# Patient Record
Sex: Female | Born: 1935
Health system: Southern US, Community
[De-identification: ages and names within clinical notes are randomized; demographics above are authoritative.]

## PROBLEM LIST (undated history)

## (undated) DIAGNOSIS — E538 Deficiency of other specified B group vitamins: Secondary | ICD-10-CM

## (undated) DIAGNOSIS — I10 Essential (primary) hypertension: Secondary | ICD-10-CM

## (undated) DIAGNOSIS — R634 Abnormal weight loss: Secondary | ICD-10-CM

## (undated) DIAGNOSIS — F32A Depression, unspecified: Secondary | ICD-10-CM

## (undated) DIAGNOSIS — G309 Alzheimer's disease, unspecified: Secondary | ICD-10-CM

## (undated) DIAGNOSIS — R1314 Dysphagia, pharyngoesophageal phase: Secondary | ICD-10-CM

## (undated) DIAGNOSIS — R911 Solitary pulmonary nodule: Secondary | ICD-10-CM

## (undated) DIAGNOSIS — M549 Dorsalgia, unspecified: Secondary | ICD-10-CM

## (undated) DIAGNOSIS — E039 Hypothyroidism, unspecified: Secondary | ICD-10-CM

## (undated) DIAGNOSIS — R1310 Dysphagia, unspecified: Secondary | ICD-10-CM

## (undated) DIAGNOSIS — E559 Vitamin D deficiency, unspecified: Secondary | ICD-10-CM

## (undated) DIAGNOSIS — R413 Other amnesia: Secondary | ICD-10-CM

## (undated) DIAGNOSIS — M8580 Other specified disorders of bone density and structure, unspecified site: Secondary | ICD-10-CM

## (undated) DIAGNOSIS — M797 Fibromyalgia: Secondary | ICD-10-CM

## (undated) DIAGNOSIS — F0280 Dementia in other diseases classified elsewhere without behavioral disturbance: Secondary | ICD-10-CM

## (undated) DIAGNOSIS — R32 Unspecified urinary incontinence: Secondary | ICD-10-CM

## (undated) DIAGNOSIS — F321 Major depressive disorder, single episode, moderate: Secondary | ICD-10-CM

## (undated) DIAGNOSIS — E079 Disorder of thyroid, unspecified: Secondary | ICD-10-CM

## (undated) HISTORY — DX: Alzheimer's disease, unspecified: G30.9

## (undated) HISTORY — DX: Dementia in other diseases classified elsewhere, unspecified severity, without behavioral disturbance, psychotic disturbance, mood disturbance, and anxiety: F02.80

## (undated) HISTORY — DX: Disorder of thyroid, unspecified: E07.9

## (undated) HISTORY — DX: Dysphagia, pharyngoesophageal phase: R13.14

## (undated) HISTORY — PX: CATARACT EXTRACTION: SUR2

## (undated) HISTORY — DX: Solitary pulmonary nodule: R91.1

## (undated) HISTORY — DX: Essential (primary) hypertension: I10

## (undated) HISTORY — DX: Other specified disorders of bone density and structure, unspecified site: M85.80

## (undated) HISTORY — DX: Fibromyalgia: M79.7

## (undated) HISTORY — DX: Hypothyroidism, unspecified: E03.9

## (undated) HISTORY — PX: APPENDECTOMY: SHX54

## (undated) HISTORY — DX: Dysphagia, unspecified: R13.10

## (undated) HISTORY — DX: Abnormal weight loss: R63.4

## (undated) HISTORY — DX: Depression, unspecified: F32.A

## (undated) HISTORY — PX: SALPINGOOPHORECTOMY: SHX82

## (undated) HISTORY — DX: Dorsalgia, unspecified: M54.9

## (undated) HISTORY — DX: Unspecified urinary incontinence: R32

## (undated) HISTORY — DX: Vitamin D deficiency, unspecified: E55.9

## (undated) HISTORY — PX: VAGINAL HYSTERECTOMY: SHX2639

## (undated) HISTORY — PX: BACK SURGERY: SHX140

## (undated) HISTORY — DX: Other amnesia: R41.3

## (undated) HISTORY — DX: Deficiency of other specified B group vitamins: E53.8

## (undated) HISTORY — DX: Major depressive disorder, single episode, moderate: F32.1

---

## 2007-03-17 ENCOUNTER — Inpatient Hospital Stay (HOSPITAL_COMMUNITY): Admission: EM | Admit: 2007-03-17 | Discharge: 2007-03-19 | Payer: Self-pay | Admitting: Emergency Medicine

## 2007-03-17 ENCOUNTER — Encounter (INDEPENDENT_AMBULATORY_CARE_PROVIDER_SITE_OTHER): Payer: Self-pay | Admitting: Pediatrics

## 2010-11-21 ENCOUNTER — Encounter
Admission: RE | Admit: 2010-11-21 | Discharge: 2010-11-21 | Payer: Self-pay | Source: Home / Self Care | Attending: Family Medicine | Admitting: Family Medicine

## 2011-03-06 NOTE — Discharge Summary (Signed)
NAME:  Jenny [name], Jenny [name]               ACCOUNT NO.:  [id number]   MEDICAL RECORD NO.:  [id number]          PATIENT TYPE:  INP   LOCATION:  3728                         FACILITY:  [facility]   PHYSICIAN:  Pramod P. Pearlean Brownie, MD    DATE OF BIRTH:  05-18-1932   DATE OF ADMISSION:  03/17/2007  DATE OF DISCHARGE:  03/19/2007                               DISCHARGE SUMMARY   ADMITTING DIAGNOSIS:  Garbled speech and left field cut.   DISCHARGE DIAGNOSES:  1. Transient speech difficulties and left-sided vision loss with      headache, likely complicated by migraine.  2. Hyperlipidemia, newly diagnosed.  3. Hypertension.  4. Hyperhomocysteinemia anemia, newly diagnosed.   HOSPITAL COURSE:  The patient was a 77 year old pleasant Caucasian lady  who was admitted with symptoms of sudden onset of speech difficulties,  with left-sided vision difficulties, not being able to see in the front  of the left eye only.  She also had a headache subsequently.  The  symptoms lasted for about an hour, and recovered completely after she  came to the emergency room.  She was evaluated by Dr. Sharene Skeans and  thought to have an NIH stroke scale of zero.  She was thought to have a  right brain TIA, and so she was admitted to the stroke team.  Telemetry  monitoring did not reveal cardiac arrhythmia.  MRI scan of the brain  subsequently showed no evidence of acute infarct.  MRA of the brain  showed no large-vessel stenosis.  There was a small 2-mm area of  possible aneurysm noted in the right cavernous portion of the internal  carotid artery.  A 2-D echo showed no obvious cardiac __________, with  normal ejection fraction.   Total cholesterol was elevated at 243, triglycerides 286, HDL 42, LDL  144.  Hemoglobin A1C was normal at 5.7.  Urinalysis was unremarkable.  CBC and basic metabolic panel were normal.  Patient was started on  aspirin for stroke prevention.  She was advised conservative followup  for a possible 2-mm  aneurysm, which was not likely to explain her  symptoms.  She was also started on a statin for her elevated cholesterol  and was advised to follow up with her family physician, Dr. Neldon Labella, in  [place], and with Dr. Sharene Skeans in a month.   DISCHARGE MEDICATIONS:  Her medications at the time of discharge were  the following:  1. Lotrel 5/20 once a day.  2. Synthroid 0.175 mg daily.  3. Premarin once a day.  4. Advil 800 mg as needed.  5. Aspirin 325 mg a day.  6. Zocor 20 mg a day.  7. Seroquel 1 tablet daily.           ______________________________  [name]. Pearlean Brownie, MD     PPS/MEDQ  D:  03/19/2007  T:  03/20/2007  Job:  [id number]   cc:   Dr. Neldon Labella in [facility]

## 2011-03-06 NOTE — H&P (Signed)
NAME:  Jenny [name], Jenny [name]               ACCOUNT NO.:  [id number]   MEDICAL RECORD NO.:  [id number]          PATIENT TYPE:  EMS   LOCATION:  MAJO                         FACILITY:  [facility]   PHYSICIAN:  Deanna Artis. Hickling, M.D.DATE OF BIRTH:  1932/01/23   DATE OF ADMISSION:  03/17/2007  DATE OF DISCHARGE:                              HISTORY & PHYSICAL   CHIEF COMPLAINT:  Garbled speech and left field cut.   [age]-year-old, widowed, right-handed, white woman, last known  normal at 11:50 a.m.  Patient was on the phone and had sudden onset of  dysfluent aphasia and visual obscuration to the left at midline.  She  arrived at [facility] at 12:24 by car.  Triage, at 1245, [facility] called  to be at 1252.  CT of brain at 1303, interpreted as normal by me at  1318.  She was assessed by Dr. Vanetta Mulders of the emergency  department.  Called placed to me at 1258 and told me that the patient  had returned to baseline.   Currently, the patient complains of a right frontal occipital headache.  No prior stroke.  A 2 year history of hypertension, remote history of  smoking greater than 20 years ago.  No other risk factors.   REVIEW OF SYSTEMS:  Twelve system review; otherwise, negative.   PAST MEDICAL HISTORY:  1. No recent illnesses.  2. She is postmenopausal.  3. She has osteoarthritis of the neck and low back.  4. Hypothyroidism.  5. Hypertension.   PAST SURGICAL HISTORY:  1. Cervical laminectomy with fusion.  2. Lumbar laminectomy.  3. Hysterectomy.  4. Appendectomy.  5. Tonsillectomy.   MEDICATIONS:  Premarin, Synthroid and a blood pressure pill.  The  Premarin dose is known at 1.25.  The Synthroid dose is unknown to me, as  is the blood pressure pill.  Family is checking.   ALLERGIES:  1. CODEINE, MAKES HER SICK.  2. SULFONAMIDES, CAUSE HIVES.   FAMILY HISTORY:  Father died of a fatal stroke in his 81s.  Mother had  Alzheimer, pneumonia and heart disease.  The pneumonia  ultimately caused  her death.  Multiple siblings with vascular and cardiac disorder.  One  sibling with stroke at age 69.   SOCIAL HISTORY:  The patient lives independently.  She is a retired  Engineer, civil (consulting).  She has been widowed for 10 years.  Her sister-in-law is at  bedside.   PHYSICAL EXAMINATION:  VITAL SIGNS:  Temperature 98.1.  Blood pressure  169/71, resting.  Pulse 71.  Respirations 22.  Oxygen saturation 98%.  HEENT:  No bruits or meningismus.  No infection.  LUNGS:  Clear.  HEART:  No murmurs.  Pulses normal.  ABDOMEN:  Soft.  Bowel sounds normal.  No hepatosplenomegaly.  Abdomen  is protuberant.  EXTREMITIES:  Normal.  NEUROLOGIC EXAMINATION:  NIH Stroke Scale was equal to 0.  Modified  Rankin was 0.  Awake, alert.  No dysphagia.  Cranial nerves:  Nonreactive pupils.  Fundi normal.  Visual fields full to double  stimulus stimuli.  Extraocular movements full.  Symmetric facial  strength.  Midline tongue.  Near conduction greater than bone conduction  bilaterally.  Motor examination:  Normal strength, tone and mass.  Good  fine motor movements.  No pronator drift.  Sensation intact, full.  Peripheral stocking glove neuropathy.  Good stereognosis.  Cerebellar  examination:  Good finger to nose.  Heel, knee, shin, gait was normal.  Both heel toe walking in tandem.  Deep tendon reflexes are diminished.  Patient had bilateral flexor plantar responses.   IMPRESSION:  Transient ischemic attack, 435.8.  By her history this  would suggest both left and right brain events.  It seems unlikely  without loss of consciousness.  Patient has no history of atrial  fibrillation and no other risk factors for embolic stroke.   PLAN:  Admit patient to the hospital.  Review MRI, MRA, 2D  echocardiogram, carotid Doppler, serum homocystine, hemoglobin A1c,  lipid panel.  She will continue to take her home medications.  She is a  nonsmoker, having quit 20 years ago and does not need smoking cessation   consultation.  She has no need for a swallowing study because this is a  transient ischemic attack.  She is not a tPA candidate because this is a  transient ischemic attack.  I appreciate the opportunity to participate  in her care.      Deanna Artis. Sharene Skeans, M.D.  Electronically Signed     [facility]/MEDQ  D:  03/17/2007  T:  03/17/2007  Job:  [id number]

## 2011-03-28 ENCOUNTER — Ambulatory Visit: Payer: Self-pay | Admitting: Family Medicine

## 2011-03-29 ENCOUNTER — Ambulatory Visit (INDEPENDENT_AMBULATORY_CARE_PROVIDER_SITE_OTHER): Payer: Medicare Other | Admitting: Family Medicine

## 2011-03-29 ENCOUNTER — Encounter: Payer: Self-pay | Admitting: Family Medicine

## 2011-03-29 VITALS — BP 139/81 | HR 69 | Ht 65.5 in | Wt 195.0 lb

## 2011-03-29 DIAGNOSIS — M722 Plantar fascial fibromatosis: Secondary | ICD-10-CM | POA: Insufficient documentation

## 2011-03-29 DIAGNOSIS — M79672 Pain in left foot: Secondary | ICD-10-CM | POA: Insufficient documentation

## 2011-03-29 DIAGNOSIS — M79609 Pain in unspecified limb: Secondary | ICD-10-CM

## 2011-03-29 NOTE — Progress Notes (Signed)
  Subjective:    Patient ID: Jenny [name], female    DOB: [birth year], 77 y.o.   MRN: [id number]  HPI Jenny [name] presents for L foot pain x 1 month.  She was referred to [place] by her PCP, Jenny [name].  Pain is located from the heel to the arch of left foot.  Pain is worse at the end of the day.  It has not limited her activities, but she did cut short her volunteer time at the [employer] on Wednesday.  She sometimes has swelling around the ankle. She also noticed that there is callous on bunion and 1st toe on left foot.  There is sometimes pain at the bunion and 1st toe.  She has not tried an medication for pain.    Review of Systems No fever, chills, falling, lightheadedness    Objective:   Physical Exam GEN: nad, aox3 EXT: +2 pulses MUSC:  Observation: high arch bilaterally, but more on left side. Callous on plantar surface of 1st toe and bunion.  Gait: antalgic, favoring the L side Palpation: mild swelling along medial malleolus. +tenderness along plantar fascia and heel   Ultrasound guided exam:     Assessment & Plan:

## 2011-03-29 NOTE — Patient Instructions (Signed)
1) You have mild plantar fasciitis and are starting to have collapse of your arches (fallen arches). 2) Wear new insoles with pads in place.  You may place additional white pads in any sandles. 3) Start doing exercises/stretches as shown - try to do these twice a day. 4) Ice bath at end of the day. 5) May use tylenol or ibuprofen as needed. 6) Follow-up as needed.

## 2011-03-29 NOTE — Assessment & Plan Note (Addendum)
L foot pain likely related to mild plantar fasciitis & foot breakdown.  She also has early bunion formation on both feet - Sports Insoles given with bilateral first ray post, small scaphoid pads, & MT pad on the left.  These were comfortable in office today.  [employer] catalog given. - Handout for plantar fascia exercises given. - Should wear shoes with wide toe box - Follow-up prn

## 2011-03-29 NOTE — Assessment & Plan Note (Signed)
Mild plantar fasciitis of the left foot with MSK ultrasound showing thickness 0.50 cm compared to 0.3 cm on the right. - Handout on plantar fascia exercise given. Sports and soles provided as noted above - Follow up as needed

## 2011-03-29 NOTE — Progress Notes (Signed)
  Subjective:    Patient ID: Jenny [name], female    DOB: [birth year], 77 y.o.   MRN: [id number]  HPI  Jenny [name] presents for L foot pain x 1 month.  She was referred to [place] by her PCP, Dr Sandi Mealy.  Pain is located from the heel to the arch of left foot.  Pain is worse at the end of the day.  It has not limited her activities, but she did cut short her volunteer time at the [employer] on Wednesday.  She sometimes has swelling around the ankle. She also noticed that there is callous on bunion and 1st toe on left foot.  There is sometimes pain at the bunion and 1st toe.  She has not tried an medication for pain. She denies any numbness or tingling.  ALLERGIES: Codeine, statins, sulfa MEDS: Aspirin, losartan, Synthroid PMH: Hypothyroid, hypertension   Review of Systems  No fever, chills, falling, lightheadedness    Objective:   Physical Exam  GEN: nad, aox3 EXT: +2 pulses MUSC:  - Foot/ankle: Ankles with full range of motion bilaterally without pain, swelling, weakness, laxity. Feet with high arch bilaterally. Morton's callous along 2nd MT head is noted.  Also with callous on plantar surface of 1st toe and bunion. Early bunion formation bilaterally left greater than right. Decreased great toe motion bilaterally with hallux rigidus. Good posterior tibialis function. Tender palpation along insertion of left plantar fascial and medial calcaneus, and no right plantar fascial tenderness.  Gait: antalgic, favoring the L side, no leg length difference Neurovascularly intact distally  MSK ultrasound: Exam of left plantar fascia revealed thickness of 0.50 cm compared to 0.28 cm on the right. No signs of tearing. No heel spurs appreciated. Brief exam of first MTP joint bilaterally shows DJD. Images saved.     Assessment & Plan:

## 2011-10-11 ENCOUNTER — Ambulatory Visit: Payer: Medicare Other | Attending: Family Medicine | Admitting: Physical Therapy

## 2011-10-11 DIAGNOSIS — IMO0001 Reserved for inherently not codable concepts without codable children: Secondary | ICD-10-CM | POA: Insufficient documentation

## 2011-10-11 DIAGNOSIS — H8110 Benign paroxysmal vertigo, unspecified ear: Secondary | ICD-10-CM | POA: Insufficient documentation

## 2011-10-17 ENCOUNTER — Ambulatory Visit: Payer: Medicare Other | Admitting: Physical Therapy

## 2011-10-29 ENCOUNTER — Encounter: Payer: Medicare Other | Admitting: Physical Therapy

## 2011-10-31 ENCOUNTER — Encounter: Payer: Medicare Other | Admitting: Physical Therapy

## 2013-06-16 ENCOUNTER — Other Ambulatory Visit: Payer: Self-pay

## 2013-06-16 ENCOUNTER — Ambulatory Visit
Admission: RE | Admit: 2013-06-16 | Discharge: 2013-06-16 | Disposition: A | Discharge status: Home / Self Care | Payer: Medicare Other | Source: Ambulatory Visit

## 2013-06-16 DIAGNOSIS — Q766 Other congenital malformations of ribs: Secondary | ICD-10-CM

## 2013-09-07 ENCOUNTER — Other Ambulatory Visit: Payer: Self-pay | Admitting: Family Medicine

## 2013-09-07 DIAGNOSIS — R609 Edema, unspecified: Secondary | ICD-10-CM

## 2013-09-08 ENCOUNTER — Ambulatory Visit
Admission: RE | Admit: 2013-09-08 | Discharge: 2013-09-08 | Disposition: A | Discharge status: Home / Self Care | Payer: 59 | Source: Ambulatory Visit | Attending: Family Medicine | Admitting: Family Medicine

## 2013-09-08 DIAGNOSIS — R609 Edema, unspecified: Secondary | ICD-10-CM

## 2013-09-08 MED ORDER — IOHEXOL 300 MG/ML  SOLN
75.0000 mL | Freq: Once | INTRAMUSCULAR | Status: AC | PRN
  Administered 2013-09-08: 75 mL via INTRAVENOUS

## 2014-10-06 ENCOUNTER — Other Ambulatory Visit: Payer: Self-pay | Admitting: Family Medicine

## 2014-10-06 DIAGNOSIS — Q678 Other congenital deformities of chest: Secondary | ICD-10-CM

## 2014-10-07 ENCOUNTER — Ambulatory Visit
Admission: RE | Admit: 2014-10-07 | Discharge: 2014-10-07 | Disposition: A | Discharge status: Home / Self Care | Payer: 59 | Source: Ambulatory Visit | Attending: Family Medicine | Admitting: Family Medicine

## 2014-10-07 DIAGNOSIS — Q678 Other congenital deformities of chest: Secondary | ICD-10-CM

## 2015-08-03 ENCOUNTER — Ambulatory Visit: Payer: Medicare Other | Attending: Family Medicine | Admitting: Rehabilitative and Restorative Service Providers"

## 2015-08-03 DIAGNOSIS — H8112 Benign paroxysmal vertigo, left ear: Secondary | ICD-10-CM | POA: Diagnosis present

## 2015-08-03 DIAGNOSIS — R269 Unspecified abnormalities of gait and mobility: Secondary | ICD-10-CM | POA: Diagnosis present

## 2015-08-03 NOTE — Therapy (Signed)
[facility] [address] [place], [place], [address] Phone: [phone]   Fax:  [phone]  Physical Therapy Evaluation  Patient Details  Name: Jenny [name] MRN: [id number] Date of Birth: 08/17/1932 Referring Provider:  Farris Has, MD  Encounter Date: 08/03/2015      PT End of Session - 08/03/15 1421    Visit Number 1   Number of Visits 8   Date for PT Re-Evaluation 09/02/15   Authorization Type G Code every 10th visit   PT Start Time 0850   PT Stop Time 0933   PT Time Calculation (min) 43 min   Activity Tolerance Patient tolerated treatment well   Behavior During Therapy [facility] for tasks assessed/performed      Past Medical History  Diagnosis Date  . Thyroid disease   . Hypertension   . Fibromyalgia   . Urinary incontinence     No past surgical history on file.  There were no vitals filed for this visit.  Visit Diagnosis:  BPPV (benign paroxysmal positional vertigo), left  Abnormality of gait      Subjective Assessment - 08/03/15 0855    Subjective The patient is known to our clinic from prior PT for vertigo in 2012.  She reports dizziness returned Saturday morning when turning over in bed described as spinning sensation lasting 5 minutes.  She had to sit still to resolve symptoms.  "I didn't know what it was for awhile", she took her BP and it was high b/c she had not taken meds.  It was 180 over "I can't remember".     Patient Stated Goals getting rid of dizziness   Currently in Pain? No/denies            [facility] PT Assessment - 08/03/15 0858    Assessment   Medical Diagnosis vertigo   Onset Date/Surgical Date 07/30/15   Prior Therapy known to our clinic from prior vestibular rehab   Balance Screen   Has the patient fallen in the past 6 months No   Has the patient had a decrease in activity level because of a fear of falling?  No   Is the patient reluctant to leave their home because of a  fear of falling?  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   Prior Function   Level of Independence Independent   Vocation Retired   Observation/Other Assessments   Focus on Therapeutic Outcomes (FOTO)  52%   Ambulation/Gait   Ambulation/Gait Yes   Ambulation/Gait Assistance 6: Modified independent (Device/Increase time)  slowed, guarded pace   Ambulation Distance (Feet) 100 Feet   Assistive device None   Gait Pattern --  moves en bloc, touches walls intermittently for support   Ambulation Surface Level   Gait velocity 2.06 ft/sec            Vestibular Assessment - 08/03/15 0859    Vestibular Assessment   General Observation R eye hypertropia   Symptom Behavior   Type of Dizziness Spinning   Frequency of Dizziness most days   Duration of Dizziness seconds to minutes   Aggravating Factors Rolling to right   Relieving Factors Head stationary   Occulomotor Exam   Occulomotor Alignment Abnormal  L eye lower   Comment bifocal lenses worn all the time   Vestibulo-Occular Reflex   VOR 1 Head Only (x 1 viewing) tiny bit of dizziness with self regulated pace x 5 reps   Positional  Testing   Dix-Hallpike Dix-Hallpike Right;Dix-Hallpike Left   Sidelying Test Sidelying Right   Horizontal Canal Testing Horizontal Canal Right;Horizontal Canal Left   Dix-Hallpike Right   Dix-Hallpike Right Symptoms No nystagmus   Dix-Hallpike Left   Dix-Hallpike Left Duration 10 seconds   Dix-Hallpike Left Symptoms Upbeat, left rotatory nystagmus   Sidelying Right   Sidelying Right Duration --  no symptoms   Sidelying Right Symptoms No nystagmus   Sidelying Left   Sidelying Left Duration --  1/10 with mild sensation of dizziness   Sidelying Left Symptoms No nystagmus   Horizontal Canal Right   Horizontal Canal Right Symptoms Normal   Horizontal Canal Left   Horizontal Canal Left Symptoms Normal                Vestibular Treatment/Exercise -  08/03/15 0912    Vestibular Treatment/Exercise   Vestibular Treatment Provided Canalith Repositioning   Canalith Repositioning Epley Manuever Left    EPLEY MANUEVER LEFT   Number of Reps  1   Overall Response  --  provoked brief duration dizziness     RESPONSE DETAILS LEFT rechecked with sensation "it could come on", but nystagmus not visible and never began spinning from subjective reports               PT Education - 08/03/15 1420    Education provided Yes   Education Details nature of BPPV   Person(s) Educated Patient   Methods Explanation   Comprehension Verbalized understanding             PT Long Term Goals - 08/03/15 1421    PT LONG TERM GOAL #1   Title The patient will be indep with HEP for habituation, high level balance.   Baseline Target date 09/02/2015   Time 4   Period Weeks   PT LONG TERM GOAL #2   Title The patient will improve functional status survey from 52% to > or equal to 65% for improved self perception of mobility.   Baseline Target date 09/02/2015   Time 4   Period Weeks   PT LONG TERM GOAL #3   Title The patient will improve gait speed from 2.06 ft/sec to > or equal to 2.62 ft/sec to demo transition to "full community ambulator classification of gait."   Baseline Target date 09/02/2015   Time 4   Period Weeks   PT LONG TERM GOAL #4   Title The patient will have negative positional testing indicating resolution of BPPV.   Baseline Target date 09/02/2015   Time 4   Period Weeks               Plan - 08/03/15 1425    Clinical Impression Statement The patient is an 81 yo female presenting to outpatient PT with reports of recurring BPPV.  She has been doing home program for R BPPV this week, and this appears improved (her original symptoms were worse to the right).  At today's evaluation, she has L BPPV.  She tolerated treatment well today.     Pt will benefit from skilled therapeutic intervention in order to improve on the  following deficits Abnormal gait;Dizziness;Decreased mobility   Rehab Potential Good   PT Frequency 2x / week   PT Duration 4 weeks   PT Treatment/Interventions Therapeutic exercise;Therapeutic activities;Balance training;Neuromuscular re-education;ADLs/Self Care Home Management;Stair training;Gait training;Canalith Repostioning;Vestibular;Patient/family education   PT Next Visit Plan Check BPPV, assess balance, provide HEP (habituation, balance, etc)   Consulted and Agree with Plan  of Care Patient          G-Codes - 08/03/15 1434    Functional Assessment Tool Used L BPPV   Functional Limitation Self care   Self Care Current Status [phone]) At least 20 percent but less than 40 percent impaired, limited or restricted   Self Care Goal Status ([id number]) At least 1 percent but less than 20 percent impaired, limited or restricted       Problem List Patient Active Problem List   Diagnosis Date Noted  . Left foot pain 03/29/2011  . Plantar fasciitis 03/29/2011    Thank you for the referral of this patient. Margretta Dittyhristina Saiya Crist, MPT  Einar Nolasco, PT 08/03/2015, 2:35 PM  [facility] [address] [place], [place], [address] Phone: [phone]   Fax:  [phone]

## 2015-08-04 ENCOUNTER — Ambulatory Visit: Payer: Medicare Other | Admitting: Rehabilitative and Restorative Service Providers"

## 2015-08-04 DIAGNOSIS — H8112 Benign paroxysmal vertigo, left ear: Secondary | ICD-10-CM

## 2015-08-04 DIAGNOSIS — R269 Unspecified abnormalities of gait and mobility: Secondary | ICD-10-CM

## 2015-08-04 NOTE — Patient Instructions (Signed)
Sit to Side-Lying   Sit on edge of bed. Lie down onto the right side and hold until dizziness stops, plus 20 seconds. Return to sitting and wait until dizziness stops, plus 20 seconds. Repeat to the left side. Repeat sequence 5 times per session. Do 2 sessions per day.  Copyright  VHI. All rights reserved.  Gaze Stabilization: Tip Card 1.Target must remain in focus, not blurry, and appear stationary while head is in motion. 2.Perform exercises with small head movements (45 to either side of midline). 3.Increase speed of head motion so long as target is in focus. 4.If you wear eyeglasses, be sure you can see target through lens (therapist will give specific instructions for bifocal / progressive lenses). 5.These exercises may provoke dizziness or nausea. Work through these symptoms. If too dizzy, slow head movement slightly. Rest between each exercise. 6.Exercises demand concentration; avoid distractions. 7.For safety, perform standing exercises close to a counter, wall, corner, or next to someone.  Copyright  VHI. All rights reserved.  Gaze Stabilization: Standing Feet Apart   Feet shoulder width apart, keeping eyes on target on wall 3 feet away, tilt head down slightly and move head side to side for 30 seconds. Repeat while moving head up and down for 30 seconds. Do 2 sessions per day.  Copyright  VHI. All rights reserved.   Feet Partial Heel-Toe, Varied Arm Positions - Eyes Open    With eyes open, right foot partially in front of the other, arms out, look straight ahead at a stationary object. Hold ___30_ seconds. Repeat __2__ times per session *with each foot forward. Do __2__ sessions per day.  Copyright  VHI. All rights reserved.   Feet Apart, Varied Arm Positions - Eyes Closed    Stand with feet shoulder width apart and arms out. Close eyes and visualize upright position. Hold __30__ seconds. Repeat _3___ times per session. Do __2__ sessions per day.  Copyright   VHI. All rights reserved.  

## 2015-08-04 NOTE — Therapy (Signed)
[facility] [address] [place], [place], [address] Phone: [phone]   Fax:  [phone]  Physical Therapy Treatment  Patient Details  Name: Jenny [name] MRN: [id number] Date of Birth: Jan 13, 1932 No Data Recorded  Encounter Date: 08/04/2015      PT End of Session - 08/04/15 1455    Visit Number 2   Number of Visits 8   Date for PT Re-Evaluation 09/02/15   Authorization Type G Code every 10th visit   PT Start Time 1020   PT Stop Time 1052   PT Time Calculation (min) 32 min   Activity Tolerance Patient tolerated treatment well   Behavior During Therapy [facility] for tasks assessed/performed      Past Medical History  Diagnosis Date  . Thyroid disease   . Hypertension   . Fibromyalgia   . Urinary incontinence     No past surgical history on file.  There were no vitals filed for this visit.  Visit Diagnosis:  BPPV (benign paroxysmal positional vertigo), left  Abnormality of gait      Subjective Assessment - 08/04/15 1023    Subjective The patient reports no episodes of vertigo since yesterday.  She tried her home exercises (provided from physician) without dizziness yesterday.   Patient Stated Goals getting rid of dizziness   Currently in Pain? No/denies                Vestibular Assessment - 08/04/15 1027    Positional Testing   Dix-Hallpike Dix-Hallpike Right;Dix-Hallpike Left   Sidelying Test Sidelying Right;Sidelying Left   Horizontal Canal Testing Horizontal Canal Right;Horizontal Canal Left   Dix-Hallpike Right   Dix-Hallpike Right Symptoms No nystagmus   Dix-Hallpike Left   Dix-Hallpike Left Symptoms No nystagmus   Sidelying Right   Sidelying Right Symptoms No nystagmus  no spinning, but sense it could come on   Sidelying Left   Sidelying Left Symptoms No nystagmus  sense that dizziness could come on, but no dizziness   Horizontal Canal Right   Horizontal Canal Right Symptoms  Normal   Horizontal Canal Left   Horizontal Canal Left Symptoms Normal                  Vestibular Treatment/Exercise - 08/04/15 0001    Vestibular Treatment/Exercise   Vestibular Treatment Provided Habituation;Gaze   Habituation Exercises [name]   Gaze Exercises X1 Viewing Horizontal   [name]   Number of Reps  2   Symptom Description  "trace" sensation of dizziness, but no nystagmus   X1 Viewing Horizontal   Foot Position standing feet apart   Time --  30 seconds      NEUROMUSCULAR RE-EDUCATION: Corner balance exercises consisting of: Standing on level surface with feet partial heel toe (attempted heel/toe but too challenging for home) with eyes open with supervision for safety.  Recommended using the wall for UE support with chair in front if needed for safety in home.       PT Education - 08/04/15 1454    Education provided Yes   Education Details HEP: corner balance, habituation, gaze x 1 viewing   Person(s) Educated Patient   Methods Explanation;Demonstration;Handout   Comprehension Verbalized understanding;Returned demonstration             PT Long Term Goals - 08/04/15 1039    PT LONG TERM GOAL #1   Title The patient will be indep with HEP for habituation, high level balance.   Baseline Pt  return demonstrates HEP today 08/04/2015   Time 4   Period Weeks   Status Achieved   PT LONG TERM GOAL #2   Title The patient will improve functional status survey from 52% to > or equal to 65% for improved self perception of mobility.   Baseline Target date 09/02/2015   Time 4   Period Weeks   PT LONG TERM GOAL #3   Title The patient will improve gait speed from 2.06 ft/sec to > or equal to 2.62 ft/sec to demo transition to "full community ambulator classification of gait."   Baseline Target date 09/02/2015   Time 4   Period Weeks   PT LONG TERM GOAL #4   Title The patient will have negative positional testing indicating resolution of BPPV.    Baseline Met on 08/04/2015   Time 4   Period Weeks   Status Achieved               Plan - 08/04/15 2032    Clinical Impression Statement The patient has no nystagmus today with positional testing, but still c/o mild sensation that vertigo could come on with movements.  PT educated her in habituation brandt daroff, gaze adaptation and high level balance exercises for home.  Cancelled next week's visits to allow the patient time to work on her home exercise program and return to clinic in 7-10 days to reassess.   PT Next Visit Plan reassess for BPPV, check HEP, progress dynamic gait and high level balance as indicated.   Consulted and Agree with Plan of Care Patient          G-Codes - 08/04/15 1434    Functional Assessment Tool Used L BPPV   Functional Limitation Self care   Self Care Current Status (L5726) At least 20 percent but less than 40 percent impaired, limited or restricted   Self Care Goal Status (O0355) At least 1 percent but less than 20 percent impaired, limited or restricted      Problem List Patient Active Problem List   Diagnosis Date Noted  . Left foot pain 03/29/2011  . Plantar fasciitis 03/29/2011    Izeyah Deike, PT 08/04/2015, 8:33 PM  [facility] [address] [place], [place], [address] Phone: [phone]   Fax:  [phone]  Name: Jenny [name] MRN: [id number] Date of Birth: [birth year]

## 2015-08-09 ENCOUNTER — Encounter: Payer: Medicare Other | Admitting: Physical Therapy

## 2015-08-11 ENCOUNTER — Encounter: Payer: Medicare Other | Admitting: Physical Therapy

## 2015-08-16 ENCOUNTER — Ambulatory Visit: Payer: Medicare Other | Admitting: Rehabilitative and Restorative Service Providers"

## 2015-08-16 ENCOUNTER — Encounter: Payer: Self-pay | Admitting: Rehabilitative and Restorative Service Providers"

## 2015-08-16 DIAGNOSIS — R269 Unspecified abnormalities of gait and mobility: Secondary | ICD-10-CM

## 2015-08-16 DIAGNOSIS — H8112 Benign paroxysmal vertigo, left ear: Secondary | ICD-10-CM | POA: Diagnosis not present

## 2015-08-16 NOTE — Patient Instructions (Signed)
Sit to Side-Lying   Sit on edge of bed. Lie down onto the right side and hold until dizziness stops, plus 20 seconds. Return to sitting and wait until dizziness stops, plus 20 seconds. Repeat to the left side. Repeat sequence 5 times per session. Do 2 sessions per day.  Copyright  VHI. All rights reserved.  Gaze Stabilization: Tip Card 1.Target must remain in focus, not blurry, and appear stationary while head is in motion. 2.Perform exercises with small head movements (45 to either side of midline). 3.Increase speed of head motion so long as target is in focus. 4.If you wear eyeglasses, be sure you can see target through lens (therapist will give specific instructions for bifocal / progressive lenses). 5.These exercises may provoke dizziness or nausea. Work through these symptoms. If too dizzy, slow head movement slightly. Rest between each exercise. 6.Exercises demand concentration; avoid distractions. 7.For safety, perform standing exercises close to a counter, wall, corner, or next to someone.  Copyright  VHI. All rights reserved.  Gaze Stabilization: Standing Feet Apart   Feet shoulder width apart, keeping eyes on target on wall 3 feet away, tilt head down slightly and move head side to side for 30 seconds. Repeat while moving head up and down for 30 seconds. Do 2 sessions per day.  Copyright  VHI. All rights reserved.   Feet Partial Heel-Toe, Varied Arm Positions - Eyes Open    With eyes open, right foot partially in front of the other, arms out, look straight ahead at a stationary object. Hold ___30_ seconds. Repeat __2__ times per session *with each foot forward. Do __2__ sessions per day.  Copyright  VHI. All rights reserved.   Feet Apart, Varied Arm Positions - Eyes Closed    Stand with feet shoulder width apart and arms out. Close eyes and visualize upright position. Hold __30__ seconds. Repeat _3___ times per session. Do __2__ sessions per day.  Copyright   VHI. All rights reserved.

## 2015-08-16 NOTE — Therapy (Signed)
[facility] [address] [place], [place], [address] Phone: [phone]   Fax:  [phone]  Physical Therapy Treatment  Patient Details  Name: Jenny [name] MRN: [id number] Date of Birth: May 09, 1932 No Data Recorded  Encounter Date: 08/16/2015      PT End of Session - 08/16/15 1047    Visit Number 3   Number of Visits 8   Date for PT Re-Evaluation 09/02/15   Authorization Type G Code every 10th visit   PT Start Time 1020   PT Stop Time 1045   PT Time Calculation (min) 25 min   Activity Tolerance Patient tolerated treatment well   Behavior During Therapy [facility] for tasks assessed/performed      Past Medical History  Diagnosis Date  . Thyroid disease   . Hypertension   . Fibromyalgia   . Urinary incontinence     No past surgical history on file.  There were no vitals filed for this visit.  Visit Diagnosis:  BPPV (benign paroxysmal positional vertigo), left  Abnormality of gait      Subjective Assessment - 08/16/15 1026    Subjective The patient reports "every once in a while" a fleeting sensation that dizziness may begin, but no spinning occurs.  She reports she has been taking care of family members so has not had as much time to do exercises.  She feels like she is back to her baseline, however "aware" of more things.   Patient Stated Goals getting rid of dizziness   Currently in Pain? No/denies           [facility] Adult PT Treatment/Exercise - 08/16/15 1047    Ambulation/Gait   Ambulation/Gait Yes   Ambulation/Gait Assistance 7: Independent   Ambulation Distance (Feet) 200 Feet   Assistive device None   Gait Pattern Within Functional Limits   Ambulation Surface Level   Gait velocity 2.68 ft/sec   Gait Comments Patient feels she has returned to her baseline level of mobility   Neuro Re-ed    Neuro Re-ed Details  Reviewed gaze x 1 viewing in standing with cues on technique x 30 seconds x 3 sets.   Reprinted and discussed progression of HEP with standing corner balance exercises with eyes closed and narrowing base of support.        SELF CARE/HOME MANAGEMENT: Discussed continuing to increase walking, and reviewed all HEP and how to progress gaze activities after discharge.      PT Long Term Goals - 08/16/15 1030    PT LONG TERM GOAL #1   Title The patient will be indep with HEP for habituation, high level balance.   Baseline Pt return demonstrates HEP today 08/04/2015   Time 4   Period Weeks   Status Achieved   PT LONG TERM GOAL #2   Title The patient will improve functional status survey from 52% to > or equal to 65% for improved self perception of mobility.   Baseline Met on 08/16/2015 with patient scoring 78%.   Time 4   Period Weeks   Status Achieved   PT LONG TERM GOAL #3   Title The patient will improve gait speed from 2.06 ft/sec to > or equal to 2.62 ft/sec to demo transition to "full community ambulator classification of gait."   Baseline Goal met on 08/16/2015 with 2.68 ft/sec gait speed.   Time 4   Period Weeks   Status Achieved   PT LONG TERM GOAL #4   Title The patient  will have negative positional testing indicating resolution of BPPV.   Baseline Met on 08/04/2015   Time 4   Period Weeks   Status Achieved               Plan - September 01, 2015 1049    Clinical Impression Statement The patient met LTGs for PT.  She continues to experience occasional sensations that dizziness may return.  PT discussed continuing gaze adaptation exercises (which provoke this sensation) to continue to decrease that occasional sensation of dizziness.   She is agreeable to work on as HEP.   PT Next Visit Plan Discharge today with HEP   Consulted and Agree with Plan of Care Patient          G-Codes - Sep 01, 2015 1057    Functional Assessment Tool Used BPPV resolved   Functional Limitation Self care   Self Care Goal Status (J2419) At least 1 percent but less than 20 percent  impaired, limited or restricted   Self Care Discharge Status [phone]) At least 1 percent but less than 20 percent impaired, limited or restricted      Problem List Patient Active Problem List   Diagnosis Date Noted  . Left foot pain 03/29/2011  . Plantar fasciitis 03/29/2011    Arlyce Circle, PT Sep 01, 2015, 10:57 AM  [facility] [address] [place], [place], [address] Phone: [phone]   Fax:  [phone]  Name: Sailor Hevia MRN: [id number] Date of Birth: June 29, 1932

## 2015-08-16 NOTE — Therapy (Signed)
[facility] [address] [place], [place], [address] Phone: [phone]   Fax:  [phone]  Patient Details  Name: Jenny [name] MRN: [id number] Date of Birth: 04-14-1932 Referring Provider:  No ref. provider found  Encounter Date: 08/16/2015  PHYSICAL THERAPY DISCHARGE SUMMARY  Visits from Start of Care: 3  Current functional level related to goals / functional outcomes:     PT Long Term Goals - 08/16/15 1030    PT LONG TERM GOAL #1   Title The patient will be indep with HEP for habituation, high level balance.   Baseline Pt return demonstrates HEP today 08/04/2015   Time 4   Period Weeks   Status Achieved   PT LONG TERM GOAL #2   Title The patient will improve functional status survey from 52% to > or equal to 65% for improved self perception of mobility.   Baseline Met on 08/16/2015 with patient scoring 78%.   Time 4   Period Weeks   Status Achieved   PT LONG TERM GOAL #3   Title The patient will improve gait speed from 2.06 ft/sec to > or equal to 2.62 ft/sec to demo transition to "full community ambulator classification of gait."   Baseline Goal met on 08/16/2015 with 2.68 ft/sec gait speed.   Time 4   Period Weeks   Status Achieved   PT LONG TERM GOAL #4   Title The patient will have negative positional testing indicating resolution of BPPV.   Baseline Met on 08/04/2015   Time 4   Period Weeks   Status Achieved        Remaining deficits: Occasional episodes that dizziness may come on with sit>stand or turns during standing--provided HEP to work on.   Education / Equipment: HEP, progression of activities.  Plan: Patient agrees to discharge.  Patient goals were met. Patient is being discharged due to meeting the stated rehab goals.  ?????       Thank you for the referral of this patient. Rudell Cobb, MPT  Jenny [name] 08/16/2015, 10:58 AM  [facility] [address] [place], [place], [address] Phone: [phone]   Fax:  [phone]

## 2015-08-19 ENCOUNTER — Encounter: Payer: Medicare Other | Admitting: Rehabilitative and Restorative Service Providers"

## 2015-10-27 ENCOUNTER — Ambulatory Visit
Admission: RE | Admit: 2015-10-27 | Discharge: 2015-10-27 | Disposition: A | Discharge status: Home / Self Care | Payer: Medicare Other | Source: Ambulatory Visit | Attending: Family Medicine | Admitting: Family Medicine

## 2015-10-27 ENCOUNTER — Other Ambulatory Visit: Payer: Self-pay | Admitting: Family Medicine

## 2015-10-27 DIAGNOSIS — M25552 Pain in left hip: Secondary | ICD-10-CM

## 2016-01-24 ENCOUNTER — Other Ambulatory Visit: Payer: Self-pay | Admitting: Family Medicine

## 2016-01-24 ENCOUNTER — Ambulatory Visit
Admission: RE | Admit: 2016-01-24 | Discharge: 2016-01-24 | Disposition: A | Discharge status: Home / Self Care | Payer: Medicare Other | Source: Ambulatory Visit | Attending: Family Medicine | Admitting: Family Medicine

## 2016-01-24 DIAGNOSIS — M542 Cervicalgia: Secondary | ICD-10-CM

## 2016-06-29 ENCOUNTER — Other Ambulatory Visit: Payer: Self-pay | Admitting: Family Medicine

## 2016-06-29 ENCOUNTER — Ambulatory Visit
Admission: RE | Admit: 2016-06-29 | Discharge: 2016-06-29 | Disposition: A | Discharge status: Home / Self Care | Payer: Medicare Other | Source: Ambulatory Visit | Attending: Family Medicine | Admitting: Family Medicine

## 2016-06-29 DIAGNOSIS — R413 Other amnesia: Secondary | ICD-10-CM

## 2016-06-29 MED ORDER — GADOBENATE DIMEGLUMINE 529 MG/ML IV SOLN
14.0000 mL | Freq: Once | INTRAVENOUS | Status: AC | PRN
  Administered 2016-06-29: 14 mL via INTRAVENOUS

## 2016-07-19 ENCOUNTER — Ambulatory Visit (INDEPENDENT_AMBULATORY_CARE_PROVIDER_SITE_OTHER): Payer: Medicare Other | Admitting: Neurology

## 2016-07-19 ENCOUNTER — Encounter: Payer: Self-pay | Admitting: Neurology

## 2016-07-19 VITALS — BP 132/71 | HR 60 | Ht 65.5 in | Wt 164.4 lb

## 2016-07-19 DIAGNOSIS — R413 Other amnesia: Secondary | ICD-10-CM | POA: Diagnosis not present

## 2016-07-19 DIAGNOSIS — R4189 Other symptoms and signs involving cognitive functions and awareness: Secondary | ICD-10-CM | POA: Diagnosis not present

## 2016-07-19 NOTE — Progress Notes (Signed)
[facility]    Provider:  Dr Lucia Gaskins Referring Provider: Farris Has, MD Primary Care Physician:  Farris Has, MD  CC:  Memory loss  HPI:  Jenny [name] is a 82 y.o. female here as a referral from Dr. Sandi Mealy for memory loss. PMHx HTN, Fibromyalgia. She doesn't remember people's names. Even people who she has known for years. Has been ongoing for at least 3 years. Has a friend here who provides much information. She has been sober for 46 years. 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 She has to write down appointments. She needs help with finances and bills sometimes, her friend helps her. She lives in the [facility] independent living. In the last 3-4 months memory loss has sped up. Sister with Alzheimers. Mother fine. 3 weeks ago she was feeling dizzy, she thought it was vertigo, she couldn;t remember how to get some pages in order for a presentation for AA. She has some depression as well and she is fearful about Alzheimers. She has an appt at [facility] for a dementia trial next month and will be following there. No other associated symptoms or modifying factors. Wory and stress is making her symptoms worse. Nothing makes them better and she can;t identify any triggers or inciting events, denies head trauma or previous illnesses. No other focal neurologic deficits or complaints.  Reviewed notes, labs and imaging from outside physicians, which showed:   B12 238, TSH .03  Personally reviewed MRI images 06/2016 and agree with the following:  FINDINGS: No evidence for acute infarction, hemorrhage, mass lesion, hydrocephalus, or extra-axial fluid. Global atrophy. Chronic microvascular ischemic change.  Flow voids are maintained. Small foci of chronic hemorrhage in the supratentorial subcortical white matter, likely sequelae of hypertensive cerebrovascular disease.  Pituitary, pineal, and  cerebellar tonsils unremarkable. No upper cervical cord lesions. Prior cervical fusion.  Visualized calvarium, skull base, and upper cervical osseous structures unremarkable. Scalp and extracranial soft tissues, orbits, sinuses, and mastoids show no acute process.  Post infusion, no abnormal enhancement of the brain or meninges. Major dural venous sinuses are patent.  Compared with 2008, there is progression of both atrophy and chronic microvascular ischemic change.  IMPRESSION: Chronic changes as described. No acute intracranial abnormality. No abnormal postcontrast enhancement.  No acute cause is seen for the reported symptoms.   Review of Systems: Patient complains of symptoms per HPI as well as the following symptoms:joint pain, aching muscles, disinterest in activities . Pertinent negatives per HPI. All others negative.   Social History   Social History  . Marital status: Widowed    Spouse name: N/A  . Number of children: 0  . Years of education: 12   Occupational History  . Retired    Social History Main Topics  . Smoking status: Former Games developer  . Smokeless tobacco: Never Used  . Alcohol use No  . Drug use: No  . Sexual activity: Not on file   Other Topics Concern  . Not on file   Social History Narrative   Lives alone   Caffeine use: sometimes    Family History  Problem Relation Age of Onset  . Pneumonia Mother   . Heart Problems Father   . Alzheimer's disease Sister     Past Medical History:  Diagnosis Date  . Fibromyalgia   . Hypertension   . Thyroid disease   . Urinary incontinence  Past Surgical History:  Procedure Laterality Date  . APPENDECTOMY    . CATARACT EXTRACTION Right   . VAGINAL HYSTERECTOMY      Current Outpatient Prescriptions  Medication Sig Dispense Refill  . Cholecalciferol (VITAMIN D3) 2000 units TABS Take 1 tablet by mouth daily.    . ferrous fumarate (HEMOCYTE - 106 MG FE) 325 (106 FE) MG TABS tablet Take  1 tablet by mouth.    . levothyroxine (SYNTHROID, LEVOTHROID) 150 MCG tablet Take 150 mcg by mouth daily.    Marland Kitchen losartan (COZAAR) 50 MG tablet Take 50 mg by mouth daily.      . Multiple Vitamins-Minerals (HM MULTIVITAMIN ADULT GUMMY PO) Take 1 Dose by mouth daily.    . vitamin B-12 (CYANOCOBALAMIN) 1000 MCG tablet Take 1,000 mcg by mouth daily.     No current facility-administered medications for this visit.     Allergies as of 07/19/2016 - Review Complete 07/19/2016  Allergen Reaction Noted  . Adhesive [tape]  07/19/2016  . Codeine Hives 03/29/2011  . Statins Other (See Comments) 03/29/2011  . Sulfa antibiotics Nausea Only 03/29/2011    Vitals: BP 132/71 (BP Location: Right Arm, Patient Position: Sitting, Cuff Size: Normal)   Pulse 60   Ht 5' 5.5" (1.664 m)   Wt 164 lb 6.4 oz (74.6 kg)   BMI 26.94 kg/m  Last Weight:  Wt Readings from Last 1 Encounters:  07/19/16 164 lb 6.4 oz (74.6 kg)   Last Height:   Ht Readings from Last 1 Encounters:  07/19/16 5' 5.5" (1.664 m)    Physical exam: Exam: Gen: NAD, conversant, well nourised, obese, well groomed                     CV: RRR, no MRG. No Carotid Bruits. No peripheral edema, warm, nontender Eyes: Conjunctivae clear without exudates or hemorrhage  Neuro: Detailed Neurologic Exam  Speech:    Speech is normal; fluent and spontaneous with normal comprehension.  Cognition:  MMSE - Mini Mental State Exam 07/19/2016  Orientation to time 4  Orientation to Place 5  Registration 3  Attention/ Calculation 5  Recall 3  Language- name 2 objects 2  Language- repeat 1  Language- follow 3 step command 3  Language- read & follow direction 1  Write a sentence 1  Copy design 1  Total score 29      The patient is oriented to person, place, and time;     recent and remote memory intact;     language fluent;     normal attention, concentration,     fund of knowledge Cranial Nerves:    The pupils are equal, round, and reactive to  light. Attempted fundoscopic exam could not visualize due to small pupils. . Visual fields are full to finger confrontation. Extraocular movements are intact. Trigeminal sensation is intact and the muscles of mastication are normal. The face is symmetric. The palate elevates in the midline. Hearing intact. Voice is normal. Shoulder shrug is normal. The tongue has normal motion without fasciculations.   Coordination:    Normal finger to nose and heel to shin. Normal rapid alternating movements.   Gait:    Heel-toe and tandem gait are normal.   Motor Observation:    No asymmetry, no atrophy, and no involuntary movements noted. Tone:    Normal muscle tone.    Posture:    Posture is normal. normal erect    Strength:    Strength is V/V in the  upper and lower limbs.      Sensation: intact to LT     Reflex Exam:  DTR's:    Deep tendon reflexes in the upper and lower extremities are normal bilaterally.   Toes:    The toes are downgoing bilaterally.   Clonus:    Clonus is absent.      Assessment/Plan:  82 year old female here for cognitive complaints, sister with Alzheimre's and she is very fearful about memory loss. Discussed dementia, different types of dementia as well as normal cognitive aging. Gave resources for Alzheimer's foundation and alzheimers association. MRI of the brain was normal for age. MMSE 29/30. Discussed Aricept and other imaging techniques as wella s neurocognitive testing. [facility] for [facility] and if she is accepted she will likely follow with them, at this point will wait and see what happens at her visit and [facility] otherwise we are happy to see her back. Also recommended therapy for her depression and anxiety.   Cc: Farris HasMORROW, AARON, MD  Naomie DeanAntonia Loys Shugars, MD  [facility] [address] [place], [place] [id number]  Phone [phone] Fax  [phone]

## 2016-07-19 NOTE — Patient Instructions (Signed)
Remember to drink plenty of fluid, eat healthy meals and do not skip any meals. Try to eat protein with a every meal and eat a healthy snack such as fruit or nuts in between meals. Try to keep a regular sleep-wake schedule and try to exercise daily, particularly in the form of walking, 20-30 minutes a day, if you can. .   Our phone number is [phone]. We also have an after hours call service for urgent matters and there is a physician on-call for urgent questions. For any emergencies you know to call 911 or go to the nearest emergency room   

## 2016-07-20 ENCOUNTER — Encounter: Payer: Self-pay | Admitting: Neurology

## 2016-07-20 DIAGNOSIS — R4189 Other symptoms and signs involving cognitive functions and awareness: Secondary | ICD-10-CM | POA: Insufficient documentation

## 2016-07-27 ENCOUNTER — Other Ambulatory Visit: Payer: Self-pay | Admitting: Family Medicine

## 2016-07-27 DIAGNOSIS — R1319 Other dysphagia: Secondary | ICD-10-CM

## 2016-08-01 ENCOUNTER — Ambulatory Visit
Admission: RE | Admit: 2016-08-01 | Discharge: 2016-08-01 | Disposition: A | Discharge status: Home / Self Care | Payer: Medicare Other | Source: Ambulatory Visit | Attending: Family Medicine | Admitting: Family Medicine

## 2016-08-01 DIAGNOSIS — R1319 Other dysphagia: Secondary | ICD-10-CM

## 2016-08-06 ENCOUNTER — Other Ambulatory Visit (HOSPITAL_COMMUNITY): Payer: Self-pay | Admitting: Physician Assistant

## 2016-08-06 DIAGNOSIS — R1310 Dysphagia, unspecified: Secondary | ICD-10-CM

## 2016-08-09 ENCOUNTER — Ambulatory Visit (HOSPITAL_COMMUNITY)
Admission: RE | Admit: 2016-08-09 | Discharge: 2016-08-09 | Disposition: A | Discharge status: Home / Self Care | Payer: Medicare Other | Source: Ambulatory Visit | Attending: Physician Assistant | Admitting: Physician Assistant

## 2016-08-09 DIAGNOSIS — R1310 Dysphagia, unspecified: Secondary | ICD-10-CM

## 2016-08-09 DIAGNOSIS — R1314 Dysphagia, pharyngoesophageal phase: Secondary | ICD-10-CM | POA: Diagnosis not present

## 2016-08-09 MED ORDER — MAGNESIUM HYDROXIDE 400 MG/5ML PO SUSP
ORAL | Status: AC
  Filled 2016-08-09: qty 30

## 2016-08-16 ENCOUNTER — Other Ambulatory Visit (HOSPITAL_COMMUNITY): Payer: Self-pay | Admitting: Physician Assistant

## 2016-08-16 DIAGNOSIS — R1310 Dysphagia, unspecified: Secondary | ICD-10-CM

## 2016-08-21 ENCOUNTER — Ambulatory Visit (HOSPITAL_COMMUNITY)
Admission: RE | Admit: 2016-08-21 | Discharge: 2016-08-21 | Disposition: A | Discharge status: Home / Self Care | Payer: Medicare Other | Source: Ambulatory Visit | Attending: Physician Assistant | Admitting: Physician Assistant

## 2016-08-21 DIAGNOSIS — E079 Disorder of thyroid, unspecified: Secondary | ICD-10-CM | POA: Diagnosis not present

## 2016-08-21 DIAGNOSIS — M797 Fibromyalgia: Secondary | ICD-10-CM | POA: Diagnosis not present

## 2016-08-21 DIAGNOSIS — I10 Essential (primary) hypertension: Secondary | ICD-10-CM | POA: Diagnosis not present

## 2016-08-21 DIAGNOSIS — R1319 Other dysphagia: Secondary | ICD-10-CM | POA: Diagnosis present

## 2016-08-21 DIAGNOSIS — R1310 Dysphagia, unspecified: Secondary | ICD-10-CM | POA: Diagnosis not present

## 2016-08-21 NOTE — Progress Notes (Signed)
Modified Barium Swallow Progress Note  Patient Details  Name: Jenny [name] MRN: [id number] Date of Birth: 08/31/1932  Today's Date: 08/21/2016  Modified Barium Swallow completed.  Full report located under Chart Review in the Imaging Section.  Brief recommendations include the following:  Clinical Impression  Pt presents with mild pharyngeal and cervical esophageal dysphagia. She has cervical hardware on C4-C6, and her vocal quality was low and breathy. Pt displayed a mild delay in swallow initiation to the valleculae for thin and solid trials. During thin liquids, pt had episodes of flash penetration and a single event of frank penetration that cleared with cued additional throat clear. Adequate airway protection was achieved for all solid trials. Pt c/o a globus sensation consuming solid textures and pills. Pt had reduced posterior tongue retraction for all PO trials, as well as reduced cricopharyngeal relaxation, resulting in trace vallecular residue. Spontaneous multiple swallows following boluses aided in minimizing residue. When given the barium pill, pan down into the esophagus revealed a delay in transit (MD present to confirm). SLP suspects that pt's s/s are esophageal in nature given no significant pharyngeal impairments observed during this study. Recommend an unrestricted diet, regular and thin liquids, giving the pt freedom to consume what she feels comfortable eating. No further ST is necessary at this time.   Swallow Evaluation Recommendations       SLP Diet Recommendations: Regular solids;Thin liquid   Liquid Administration via: Cup   Medication Administration: Whole meds with liquid   Supervision: Patient able to self feed   Compensations: Minimize environmental distractions;Slow rate;Small sips/bites   Postural Changes: Remain semi-upright after after feeds/meals (Comment);Seated upright at 90 degrees   Oral Care Recommendations: Patient independent with oral care      Tollie EthHaleigh Ragan Dajai Wahlert, Student SLP  Caryl NeverHaleigh R Nathin Saran 08/21/2016,2:46 PM

## 2017-01-02 ENCOUNTER — Other Ambulatory Visit: Payer: Self-pay | Admitting: Neurology

## 2017-01-02 ENCOUNTER — Telehealth: Payer: Self-pay | Admitting: Neurology

## 2017-01-02 MED ORDER — DONEPEZIL HCL 10 MG PO TABS: ORAL_TABLET | ORAL | 11 refills | Status: DC

## 2017-01-02 NOTE — Telephone Encounter (Signed)
Patient states [facility] is [facility] going [facility] and would like [facility]pt called to [facility] [address].

## 2017-01-02 NOTE — Telephone Encounter (Signed)
Thanks Jan I did go ahead and order it.

## 2017-01-22 ENCOUNTER — Telehealth: Payer: Self-pay | Admitting: Neurology

## 2017-01-22 NOTE — Telephone Encounter (Signed)
Called pt. She was unable to tolerate donepezil but would like to try memantine.

## 2017-01-22 NOTE — Addendum Note (Signed)
Addended by: Donnelly Angelica on: 01/22/2017 05:38 PM   Modules accepted: Orders

## 2017-01-22 NOTE — Telephone Encounter (Signed)
Patient called office in reference to donepezil (ARICEPT) 10 MG tablet.  Patient states she has disconnected due  to bad muscles cramps and dizziness.  Patient took medication for 3-4 days total.  Patient will be leaving this afternoon around 1:00pm call before or tomorrow.  Please call

## 2017-01-23 MED ORDER — MEMANTINE HCL 5 MG PO TABS: 5.0000 mg | ORAL_TABLET | Freq: Every day | ORAL | 0 refills | Status: DC

## 2017-01-23 MED ORDER — MEMANTINE HCL 10 MG PO TABS: 10.0000 mg | ORAL_TABLET | Freq: Two times a day (BID) | ORAL | 11 refills | Status: DC

## 2017-01-23 NOTE — Addendum Note (Signed)
Addended by: Levert Feinstein on: 01/23/2017 08:35 AM   Modules accepted: Orders

## 2017-01-23 NOTE — Telephone Encounter (Signed)
Agree with Namenda 10 mg twice a day, also provide the name to research for mild dementia trial.

## 2017-01-28 NOTE — Telephone Encounter (Signed)
Patient states she got Rx for Namenda  and Namenda . Please call patient and discuss dosages.

## 2017-01-28 NOTE — Telephone Encounter (Signed)
Returned pt TC. Instructed her to start w/ Namenda 5 mg at bedtime, then increase to 5 mg BID if tolerated. If does ok on that dose, then can start on 10 mg, taking twice per day. Verbalized understanding and appreciation for call.

## 2018-03-26 ENCOUNTER — Ambulatory Visit: Payer: Medicare Other | Admitting: Neurology

## 2018-03-26 ENCOUNTER — Encounter: Payer: Self-pay | Admitting: Neurology

## 2018-03-26 ENCOUNTER — Encounter: Payer: Self-pay | Admitting: *Deleted

## 2018-03-26 ENCOUNTER — Telehealth: Payer: Self-pay | Admitting: *Deleted

## 2018-03-26 VITALS — BP 131/78 | HR 57 | Ht 65.5 in | Wt 147.0 lb

## 2018-03-26 DIAGNOSIS — R453 Demoralization and apathy: Secondary | ICD-10-CM

## 2018-03-26 DIAGNOSIS — R4189 Other symptoms and signs involving cognitive functions and awareness: Secondary | ICD-10-CM

## 2018-03-26 DIAGNOSIS — R4586 Emotional lability: Secondary | ICD-10-CM | POA: Diagnosis not present

## 2018-03-26 DIAGNOSIS — F0280 Dementia in other diseases classified elsewhere without behavioral disturbance: Secondary | ICD-10-CM | POA: Diagnosis not present

## 2018-03-26 DIAGNOSIS — R413 Other amnesia: Secondary | ICD-10-CM

## 2018-03-26 DIAGNOSIS — R4701 Aphasia: Secondary | ICD-10-CM

## 2018-03-26 DIAGNOSIS — F0391 Unspecified dementia with behavioral disturbance: Secondary | ICD-10-CM

## 2018-03-26 MED ORDER — DONEPEZIL HCL 10 MG PO TABS: ORAL_TABLET | ORAL | 11 refills | Status: DC

## 2018-03-26 NOTE — Telephone Encounter (Signed)
After pt's office visit today, Robin, Dr. Vincente LibertyMorrow's nurse called back and clarified pt's most recent meds as of 5/24. These were updated via chart abstract. She also clarified pt's allergies which were also updated in chart however Aricept was reported as an allergy (dizziness). Spoke with Dr. Lucia GaskinsAhern. Per MD, please call patient and tell her not to start the Aricept right now. We will see what her imaging shows. Aricept d/c'd in chart and called [facility], spoke with pharmacist Evette and canceled prescription.   Called pt on both home & mobile #s & LVM asking for call back.

## 2018-03-26 NOTE — Patient Instructions (Signed)
Dementia Dementia is the loss of two or more brain functions, such as:  Memory.  Decision making.  Behavior.  Speaking.  Thinking.  Problem solving.  There are many types of dementia. The most common type is called progressive dementia. Progressive dementia gets worse with time and it is irreversible. An example of this type of dementia is Alzheimer disease. What are the causes? This condition may be caused by:  Nerve cell damage in the brain.  Genetic mutations.  Certain medicines.  Multiple small strokes.  An infection, such as chronic meningitis.  A metabolic problem, such as vitamin B12 deficiency or thyroid disease.  Pressure on the brain, such as from a tumor or blood clot.  What are the signs or symptoms? Symptoms of this condition include:  Sudden changes in mood.  Depression.  Problems with balance.  Changes in personality.  Poor short-term memory.  Agitation.  Delusions.  Hallucinations.  Having a hard time: ? Speaking thoughts. ? Finding words. ? Solving problems. ? Doing familiar tasks. ? Understanding familiar ideas.  How is this diagnosed? This condition is diagnosed with an assessment by your health care provider. During this assessment, your health care provider will talk with you and your family, friends, or caregivers about your symptoms. A thorough medical history will be taken, and you will have a physical exam and tests. Tests may include:  Lab tests, such as blood or urine tests.  Imaging tests, such as a CT scan, PET scan, or MRI.  A lumbar puncture. This test involves removing and testing a small amount of the fluid that surrounds the brain and spinal cord.  An electroencephalogram (EEG). In this test, small metal discs are used to measure electrical activity in the brain.  Memory tests, cognitive tests, and neuropsychological tests. These tests evaluate brain function.  How is this treated? Treatment depends on the  cause of the dementia. It may involve taking medicines that may help:  To control the dementia.  To slow down the disease.  To manage symptoms.  In some cases, treating the cause of the dementia can improve symptoms, reverse symptoms, or slow down how quickly the dementia gets worse. Your health care provider can help direct you to support groups, organizations, and other health care providers who can help with decisions about your care. Follow these instructions at home: Medicine  Take over-the-counter and prescription medicines only as told by your health care provider.  Avoid taking medicines that can affect thinking, such as pain or sleeping medicines. Lifestyle   Make healthy lifestyle choices: ? Be physically active as told by your health care provider. ? Do not use any tobacco products, such as cigarettes, chewing tobacco, and e-cigarettes. If you need help quitting, ask your health care provider. ? Eat a healthy diet. ? Practice stress-management techniques when you get stressed. ? Stay social.  Drink enough fluid to keep your urine clear or pale yellow.  Make sure to get quality sleep. These tips can help you to get a good night's rest: ? Avoid napping during the day. ? Keep your sleeping area dark and cool. ? Avoid exercising during the few hours before you go to bed. ? Avoid caffeine products in the evening. General instructions  Work with your health care provider to determine what you need help with and what your safety needs are.  If you were given a bracelet that tracks your location, make sure to wear it.  Keep all follow-up visits as told by your   health care provider. This is important. Contact a health care provider if:  You have any new symptoms.  You have problems with choking or swallowing.  You have any symptoms of a different illness. Get help right away if:  You develop a fever.  You have new or worsening confusion.  You have new or  worsening sleepiness.  You have a hard time staying awake.  You or your family members become concerned for your safety. This information is not intended to replace advice given to you by your health care provider. Make sure you discuss any questions you have with your health care provider. Document Released: 04/03/2001 Document Revised: 02/16/2016 Document Reviewed: 07/06/2015 Elsevier Interactive Patient Education  2018 Elsevier Inc.  

## 2018-03-26 NOTE — Progress Notes (Signed)
[facility]    Provider:  Dr Lucia Gaskins Referring Provider: Farris Has, MD Primary Care Physician:  Farris Has, MD  CC:  Memory loss  Interval history 6/5/20019: She is here with a friend who provides much information. She is "really getting worse". She cna;t remember what she is doing, she forgets tasks, she forgets dates and appointments, writing things down, still endorses everything in previous note (see below) from 2 years ago but worsening. She lives in an apartment. She is forgetting to take her medication. She has aphasia as well per friend. She has a lot of friends. Appears to have good insight. Sh eneeds reminders from friends. Not driving anymore, because she got lost driving, if she is someplace unfamiliar she can get lost and gets panicked. Has difficulty learning new things. She has very good insight. Friend says she notices she is withdrawing, apathy, not caring to go places also socially she is withdrawing. Had side effects to Namenda.  Dizziness with Aricept.  HPI:  Jenny [name] is a 84 y.o. female here as a referral from Dr. Kateri Plummer for memory loss. PMHx HTN, Fibromyalgia. She doesn't remember people's names. Even people who she has known for years. Has been ongoing for at least 3 years. Has a friend here who provides much information. She has been sober for 46 years. 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 She has to write down appointments. She needs help with finances and bills sometimes, her friend helps her. She lives in the [place] independent living. In the last 3-4 months memory loss has sped up. Sister with Alzheimers. Mother fine. 3 weeks ago she was feeling dizzy, she thought it was vertigo, she couldn;t remember how to get some pages in order for a presentation for AA. She has some depression as well and she is fearful about Alzheimers. She has an appt at [facility] for a  dementia trial next month and will be following there. No other associated symptoms or modifying factors. Wory and stress is making her symptoms worse. Nothing makes them better and she can;t identify any triggers or inciting events, denies head trauma or previous illnesses. No other focal neurologic deficits or complaints.  Reviewed notes, labs and imaging from outside physicians, which showed:   B12 238, TSH .03  Personally reviewed MRI images 06/2016 and agree with the following:  FINDINGS: No evidence for acute infarction, hemorrhage, mass lesion, hydrocephalus, or extra-axial fluid. Global atrophy. Chronic microvascular ischemic change.  Flow voids are maintained. Small foci of chronic hemorrhage in the supratentorial subcortical white matter, likely sequelae of hypertensive cerebrovascular disease.  Pituitary, pineal, and cerebellar tonsils unremarkable. No upper cervical cord lesions. Prior cervical fusion.  Visualized calvarium, skull base, and upper cervical osseous structures unremarkable. Scalp and extracranial soft tissues, orbits, sinuses, and mastoids show no acute process.  Post infusion, no abnormal enhancement of the brain or meninges. Major dural venous sinuses are patent.  Compared with 2008, there is progression of both atrophy and chronic microvascular ischemic change.  IMPRESSION: Chronic changes as described. No acute intracranial abnormality. No abnormal postcontrast enhancement.  No acute cause is seen for the reported symptoms.   Review of Systems: Patient complains of symptoms per HPI as well as the following symptoms:joint pain, aching muscles, disinterest in activities . Pertinent negatives per HPI. All others negative.   Social History   Socioeconomic History  . Marital status:  Widowed    Spouse name: Not on file  . Number of children: 0  . Years of education: 21  . Highest education level: Not on file  Occupational History  .  Occupation: Retired  Engineer, production  . Financial resource strain: Not on file  . Food insecurity:    Worry: Not on file    Inability: Not on file  . Transportation needs:    Medical: Not on file    Non-medical: Not on file  Tobacco Use  . Smoking status: Former Games developer  . Smokeless tobacco: Never Used  Substance and Sexual Activity  . Alcohol use: No  . Drug use: No  . Sexual activity: Not on file  Lifestyle  . Physical activity:    Days per week: Not on file    Minutes per session: Not on file  . Stress: Not on file  Relationships  . Social connections:    Talks on phone: Not on file    Gets together: Not on file    Attends religious service: Not on file    Active member of club or organization: Not on file    Attends meetings of clubs or organizations: Not on file    Relationship status: Not on file  . Intimate partner violence:    Fear of current or ex partner: Not on file    Emotionally abused: Not on file    Physically abused: Not on file    Forced sexual activity: Not on file  Other Topics Concern  . Not on file  Social History Narrative   Lives alone   Caffeine use: sometimes   Right handed    Family History  Problem Relation Age of Onset  . Pneumonia Mother   . Heart Problems Father   . Alzheimer's disease Sister   . Heart Problems Other     Past Medical History:  Diagnosis Date  . Fibromyalgia   . Hypertension   . Thyroid disease   . Urinary incontinence    stress incontinence per pt     Past Surgical History:  Procedure Laterality Date  . APPENDECTOMY    . CATARACT EXTRACTION Right   . SALPINGOOPHORECTOMY    . VAGINAL HYSTERECTOMY      Current Outpatient Medications  Medication Sig Dispense Refill  . ferrous fumarate (HEMOCYTE - 106 MG FE) 325 (106 FE) MG TABS tablet Take 1 tablet by mouth.    . levothyroxine (SYNTHROID, LEVOTHROID) 150 MCG tablet Take 150 mcg by mouth daily.    Marland Kitchen OVER THE COUNTER MEDICATION vitamins    . CALCIUM  CARBONATE-VITAMIN D PO Take by mouth. Calcium Carbonate 600 mg Vitamin D 400 mg in tablet    . Cholecalciferol (VITAMIN D3 PO) Take 2 capsules by mouth daily.    Marland Kitchen losartan (COZAAR) 100 MG tablet Take 50 mg by mouth daily.    . sertraline (ZOLOFT) 100 MG tablet Take 100 mg by mouth daily.    . vitamin B-12 (CYANOCOBALAMIN) 1000 MCG tablet Take 2,000 mcg by mouth daily.      No current facility-administered medications for this visit.     Allergies as of 03/26/2018 - Review Complete 03/26/2018  Allergen Reaction Noted  . Aricept [donepezil]  03/26/2018  . Benazepril Cough 03/26/2018  . Codeine Hives 03/29/2011  . Namenda [memantine]  03/26/2018  . Statins Other (See Comments) 03/29/2011  . Sulfa antibiotics Hives and Nausea Only 03/29/2011  . Adhesive [tape] Rash 07/19/2016    Vitals: BP 131/78 (BP  Location: Right Arm, Patient Position: Sitting)   Pulse (!) 57   Ht 5' 5.5" (1.664 m)   Wt 147 lb (66.7 kg)   BMI 24.09 kg/m  Last Weight:  Wt Readings from Last 1 Encounters:  03/26/18 147 lb (66.7 kg)   Last Height:   Ht Readings from Last 1 Encounters:  03/26/18 5' 5.5" (1.664 m)    Physical exam: Exam: Gen: NAD, conversant, well nourised, obese, well groomed                     CV: RRR, no MRG. No Carotid Bruits. No peripheral edema, warm, nontender Eyes: Conjunctivae clear without exudates or hemorrhage  Neuro: Detailed Neurologic Exam  Speech:    Speech is normal; fluent and spontaneous with normal comprehension.  Cognition:  MMSE - Mini Mental State Exam 03/26/2018 07/19/2016  Orientation to time 2 4  Orientation to Place 5 5  Registration 3 3  Attention/ Calculation 5 5  Recall 3 3  Language- name 2 objects 2 2  Language- repeat 1 1  Language- follow 3 step command 3 3  Language- read & follow direction 1 1  Write a sentence 1 1  Copy design 1 1  Total score 27 29      The patient is oriented to person, place, and time;     recent and remote memory  intact;     language fluent;     normal attention, concentration,     fund of knowledge Cranial Nerves:    The pupils are equal, round, and reactive to light. Attempted fundoscopic exam could not visualize due to small pupils. . Visual fields are full to finger confrontation. Extraocular movements are intact. Trigeminal sensation is intact and the muscles of mastication are normal. The face is symmetric. The palate elevates in the midline. Hearing intact. Voice is normal. Shoulder shrug is normal. The tongue has normal motion without fasciculations.   Coordination:    Normal finger to nose and heel to shin. Normal rapid alternating movements.   Gait:    Heel-toe and tandem gait are normal.   Motor Observation:    No asymmetry, no atrophy, and no involuntary movements noted. Tone:    Normal muscle tone.    Posture:    Posture is normal. normal erect    Strength:    Strength is V/V in the upper and lower limbs.      Sensation: intact to LT     Reflex Exam:  DTR's:    Deep tendon reflexes in the upper and lower extremities are normal bilaterally.   Toes:    The toes are downgoing bilaterally.   Clonus:    Clonus is absent.      Assessment/Plan:  [age] year old female here for cognitive complaints, sister with Alzheimre's and she is very fearful about memory loss. Discussed dementia, different types of dementia as well as normal cognitive aging. Gave resources for Alzheimer's foundation and alzheimers association. MRI of the brain was normal for age. MMSE 29/30 now 27/30 and she feels her cognition is worsening, here with a friend who also provides much information. . Discussed  other imaging techniques as wella s neurocognitive testing.   - FDG PET Scan to differentiate between Frontotemporal vs Alzheimers. Progressive memory loss shown. She has symptoms of both including short term memory loss and difficulty with executive function which is mor Alzheimers but also endorses aphasia,  more apathetic about her friends and social engagements, mood  changes which is more in line with FTD.  - Formal Neurocog Testing - Discussed clincial trials - She did not tolerate Aricept or Namenda, hol off for now  Orders Placed This Encounter  Procedures  . NM PET Metabolic Brain  . Basic Metabolic Panel  . B12 and Folate Panel  . Methylmalonic acid, serum  . TSH  . RPR  . Homocysteine  . Ambulatory referral to Neuropsychology     Cc: Farris Has, MD  Naomie Dean, MD  [facility] [address] [place], [place] [address]  Phone [phone] Fax [phone]  A total of 40 minutes was spent face-to-face with this patient. Over half this time was spent on counseling patient on the dementia diagnosis and different diagnostic and therapeutic options, counseling and coordination of care, risks ans benefits of management, compliance, or risk factor reduction and education.

## 2018-03-26 NOTE — Telephone Encounter (Signed)
Pt returned my call. Discussed that Dr. Vincente LibertyMorrow's office had it on file that Aricept caused dizziness. Pt said "ok then that's the one that caused dizziness". The patient still was not sure about an allergy to memantine but this is what she reported in her office visit today. Discussed that given the new information, pt should not start Donepezil (Aricept) at this time and we will see what the imaging shows. Pt verbalized understanding. She is aware that it was also [facility] at [facility] and will not be filled. Pt had no further concerns at this time.

## 2018-03-28 ENCOUNTER — Other Ambulatory Visit: Payer: Self-pay | Admitting: Neurology

## 2018-03-28 DIAGNOSIS — E538 Deficiency of other specified B group vitamins: Secondary | ICD-10-CM

## 2018-03-28 DIAGNOSIS — E519 Thiamine deficiency, unspecified: Secondary | ICD-10-CM

## 2018-03-28 LAB — B12 AND FOLATE PANEL
Folate: 9 ng/mL
Vitamin B-12: 344 pg/mL (ref 232–1245)

## 2018-03-28 LAB — BASIC METABOLIC PANEL WITH GFR
BUN/Creatinine Ratio: 12 (ref 12–28)
BUN: 10 mg/dL (ref 8–27)
CO2: 28 mmol/L (ref 20–29)
Calcium: 10.5 mg/dL — ABNORMAL HIGH (ref 8.7–10.3)
Chloride: 100 mmol/L (ref 96–106)
Creatinine, Ser: 0.85 mg/dL (ref 0.57–1.00)
GFR calc Af Amer: 73 mL/min/{1.73_m2}
GFR calc non Af Amer: 63 mL/min/{1.73_m2}
Glucose: 78 mg/dL (ref 65–99)
Potassium: 4.6 mmol/L (ref 3.5–5.2)
Sodium: 142 mmol/L (ref 134–144)

## 2018-03-28 LAB — RPR: RPR Ser Ql: NONREACTIVE

## 2018-03-28 LAB — TSH: TSH: 32.93 u[IU]/mL — AB (ref 0.450–4.500)

## 2018-03-28 LAB — HOMOCYSTEINE: HOMOCYSTEINE: 25.3 umol/L — AB (ref 0.0–15.0)

## 2018-03-28 LAB — METHYLMALONIC ACID, SERUM: Methylmalonic Acid: 635 nmol/L — ABNORMAL HIGH (ref 0–378)

## 2018-03-31 ENCOUNTER — Encounter: Payer: Self-pay | Admitting: Psychology

## 2018-03-31 ENCOUNTER — Telehealth: Payer: Self-pay | Admitting: *Deleted

## 2018-03-31 NOTE — Telephone Encounter (Signed)
Called patient with lab results. Her sister-in-law, [place] [facility]Lois [facility] phone and asked for results. Advised her the patient has a B12 deficiency. This has been shown to worsen or even cause dementia. She is unsure if patient is already taking B12. This RN advised Dr Lucia GaskinsAhern recommends  1000mcg B12 daily po long term. She should recheck the following labs in 3 months: B12, methylmalonic acid and homocysteine. Advised we can do it here if they want to travel or they can request it from her pcp. [place] LionsLois wrote results down and the labs to be repeated. She stated will have at PCP, verbalized understanding, appreciation of call.

## 2018-07-24 ENCOUNTER — Ambulatory Visit: Payer: Medicare Other | Admitting: Psychology

## 2018-07-24 ENCOUNTER — Ambulatory Visit (INDEPENDENT_AMBULATORY_CARE_PROVIDER_SITE_OTHER): Payer: Medicare Other | Admitting: Psychology

## 2018-07-24 ENCOUNTER — Encounter: Payer: Self-pay | Admitting: Psychology

## 2018-07-24 DIAGNOSIS — R413 Other amnesia: Secondary | ICD-10-CM

## 2018-07-24 NOTE — Progress Notes (Signed)
NEUROBEHAVIORAL STATUS EXAM   Name: Jenny [name] Date of Birth: 1932/02/15 Date of Interview: 07/24/2018  Reason for Referral:  Jenny [name] is a 84 y.o. female who is referred for neuropsychological evaluation by Dr. Naomie Dean of [facility] due to concerns about dementia. This patient is accompanied in the office by her sister in law, Jenny [name], who supplements the history.  History of Presenting Problem:  Jenny [name] was seen by Dr. Lucia Gaskins for neurologic consultation of memory loss in September 2017 and June 2019. MRI brain on 06/29/2016 reportedly revealed global atrophy and chronic microvascular ischemic change, with progression from 2008; there was no acute abnormality and no abnormal post-contrast enhancement. MMSE was 27/30 on 03/26/2018. At that visit, she reported she had given up driving due to confusion and getting lost. She continued to live independently. She has tried Aricept and Namenda in the past but did not tolerate either medication. Labs showed vitamin B12 deficiency.  At today's visit, the patient and her sister in law report gradual onset of memory difficulty approximately 2 years ago, perhaps a bit longer. There has been gradual, progressive worsening over time. The patient reports forgetfulness for recent conversations and events, telephone numbers, and upcoming plans. She endorses misplacing/losing items, trouble staying on task, and navigational difficulties when she was driving. Her sister in law adds that she repeats questions/statements. The patient continues to live alone in an apartment in a senior housing community. She gave up driving at the urging of her sister in law after she got lost when driving. She was very angry about that for a while. She also started having difficulty managing her medication. She lost all her medication and had not been taking it for a while. Now her sister checks behind her to make sure her pill planner is filled correctly and  to make sure she is taking the medications correctly. This has been working well. Her sister in law also helped her get automatic withdrawal set up for her bills because she was having trouble managing her finances. Jenny [name] gets the bank account statements and then provides a copy to the patient. Jenny [name] goes to doctor's appointments with the patient. The patient's friends help get her to other engagements. The patient continues to do her own shopping (a friend brings her home), laundry and housekeeping. She prepares simple meals and heats up food without difficulty.  The patient has a family history of early onset dementia (Alzheimer's disease in her half sister with symptoms beginning in her 71s) and late onset dementia in her mother.  Jenny [name] is an alcoholic in recovery and sober for 48 years. She continues to be involved in AA and she has several lady friends who she sponsored in the past who look out for her. Jenny [name] lives about 40 mins away from her and visits at least 1-2 times per week.   With regard to mood, Jenny [name] reports that she "can get mad in a heartbeat". She was never an angry person before, and it bothers her that she gets so angry now. Her sister in law agrees this has been a big change in personality. Per Jenny [name], her temper has been "out of control" at times, especially when she wasn't taking her Zoloft.  [name] does notice improvement in mood/anger with Zoloft. The patient also admits to sad/depressed mood and states she feels she is grieving "for who I was". She has had difficulty adjusting to end of life years, and accepting declining independence. She denies suicidal  ideation or intention. She is not having difficulty sleeping. Appetite is reduced she lost ~35 lbs unintentionally. Jenny [name] has been concerned about this and encourages her to eat meals even if she does not feel particularly hungry.  She has not had any hallucinations.   Social History: Born/Raised: [place]. The patient is Native  American. 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 Marital history: Widowed since the 1990s. She has no children. Alcohol: Former alcoholic, sober for 48 years, continues to participate in AA Tobacco: Former, per records   Medical History: Past Medical History:  Diagnosis Date  . Fibromyalgia   . Hypertension   . Thyroid disease   . Urinary incontinence    stress incontinence per pt      Current Medications:  Outpatient Encounter Medications as of 07/24/2018  Medication Sig  . CALCIUM CARBONATE-VITAMIN D PO Take by mouth. Calcium Carbonate 600 mg Vitamin D 400 mg in tablet  . Cholecalciferol (VITAMIN D3 PO) Take 2 capsules by mouth daily.  . ferrous fumarate (HEMOCYTE - 106 MG FE) 325 (106 FE) MG TABS tablet Take 1 tablet by mouth.  . levothyroxine (SYNTHROID, LEVOTHROID) 150 MCG tablet Take 150 mcg by mouth daily.  Marland Kitchen losartan (COZAAR) 100 MG tablet Take 50 mg by mouth daily.  Marland Kitchen OVER THE COUNTER MEDICATION vitamins  . sertraline (ZOLOFT) 100 MG tablet Take 100 mg by mouth daily.  . vitamin B-12 (CYANOCOBALAMIN) 1000 MCG tablet Take 2,000 mcg by mouth daily.    No facility-administered encounter medications on file as of 07/24/2018.      Behavioral Observations:   Appearance: Neatly, casually and appropriately dressed and groomed Gait: Ambulated independently, no gross abnormalities observed Speech: Fluent; normal rate, rhythm and volume. No significant word finding difficulty during conversational speech and interview. She does repeat herself without awareness of having done so. Thought process: Linear, goal directed Affect: Full, generally euthymic, appropriate to context Interpersonal: Pleasant, appropriate   50 minutes spent face-to-face with patient completing neurobehavioral status exam. 30 minutes spent integrating medical  records/clinical data and completing this report. CPT [id number] unit.   TESTING: There is medical necessity to proceed with neuropsychological assessment as the results will be used to aid in differential diagnosis and clinical decision-making and to inform specific treatment recommendations. Per the patient, her sister in law and medical records reviewed, there has been a change in cognitive functioning and a reasonable suspicion of dementia (suspect AD).  Clinical Decision Making: In considering the patient's current level of functioning, level of presumed impairment, nature of symptoms, emotional and behavioral responses during the interview, level of literacy, and observed level of motivation, a battery of tests was selected and communicated to the psychometrician.   Following the clinical interview/neurobehavioral status exam, the patient completed this full battery of neuropsychological testing with my psychometrician under my supervision (see separate note).   PLAN: The patient will return to see me for a follow-up session at which time her test performances and my impressions and treatment recommendations will be reviewed in detail.  Evaluation ongoing; full report to follow.

## 2018-07-24 NOTE — Progress Notes (Signed)
   Neuropsychology Note  [name] completed 60 minutes of neuropsychological testing with technician, Wallace Keller, BS, under the supervision of Dr. Elvis Coil, Licensed Psychologist. The patient did not appear overtly distressed by the testing session, per behavioral observation or via self-report to the technician. Rest breaks were offered.   Clinical Decision Making: In considering the patient's current level of functioning, level of presumed impairment, nature of symptoms, emotional and behavioral responses during the interview, level of literacy, and observed level of motivation/effort, a battery of tests was selected and communicated to the psychometrician.  Communication between the psychologist and technician was ongoing throughout the testing session and changes were made as deemed necessary based on patient performance on testing, technician observations and additional pertinent factors such as those listed above.  Jenny [name] will return within approximately 2 weeks for an interactive feedback session with Dr. Alinda Dooms at which time her test performances, clinical impressions and treatment recommendations will be reviewed in detail. The patient understands she can contact our office should she require our assistance before this time.  35 minutes spent performing neuropsychological evaluation services/clinical decision making (psychologist). [CPT 96132] 60 minutes spent face-to-face with patient administering standardized tests, 30 minutes spent scoring (technician). [CPT [id number], 96139]  Full report to follow.

## 2018-08-04 NOTE — Progress Notes (Signed)
NEUROPSYCHOLOGICAL EVALUATION   Name:    Jenny [name]  Date of Birth:   05/12/1932 Date of Interview:  07/24/2018 Date of Testing:  07/24/2018   Date of Feedback:  08/05/2018       Background Information:  Reason for Referral:  Jenny [name] is a 84 y.o. female referred by Dr. Sarina Ill of [facility] to assess her current level of cognitive functioning and assist in differential diagnosis. The current evaluation consisted of a review of available medical records, an interview with the patient and her sister in law, and the completion of a neuropsychological testing battery. Informed consent was obtained.  History of Presenting Problem:  Jenny [name] was seen by Dr. Jaynee Eagles for neurologic consultation of memory loss in September 2017 and June 2019. MRI brain on 06/29/2016 reportedly revealed global atrophy and chronic microvascular ischemic change, with progression from 2008; there was no acute abnormality and no abnormal post-contrast enhancement. MMSE was 27/30 on 03/26/2018. At that visit, she reported she had given up driving due to confusion and getting lost. She continued to live independently. She has tried Aricept and Namenda in the past but did not tolerate either medication. Labs showed vitamin B12 deficiency.  At today's visit, the patient and her sister in law report gradual onset of memory difficulty approximately 2 years ago, perhaps a bit longer. There has been gradual, progressive worsening over time. The patient reports forgetfulness for recent conversations and events, telephone numbers, and upcoming plans. She endorses misplacing/losing items, trouble staying on task, and navigational difficulties when she was driving. Her sister in law adds that she repeats questions/statements. The patient continues to live alone in an apartment in a senior housing community. She gave up driving at the urging of her sister in law after she got lost when driving. She was very  angry about that for a while. She also started having difficulty managing her medication. She lost all her medication and had not been taking it for a while. Now her sister checks behind her to make sure her pill planner is filled correctly and to make sure she is taking the medications correctly. This has been working well. Her sister in law also helped her get automatic withdrawal set up for her bills because she was having trouble managing her finances. Jenny [name] gets the bank account statements and then provides a copy to the patient. Jenny [name] goes to doctor's appointments with the patient. The patient's friends help get her to other engagements. The patient continues to do her own shopping (a friend brings her home), laundry and housekeeping. She prepares simple meals and heats up food without difficulty.  The patient has a family history of early onset dementia (Alzheimer's disease in her half sister with symptoms beginning in her 47s) and late onset dementia in her mother.  Jenny [name] is an alcoholic in recovery and sober for 48 years. She continues to be involved in [place] and she has several lady friends who she sponsored in the past who look out for her. Jenny [name] lives about 40 mins away from her and visits at least 1-2 times per week.   With regard to mood, Jenny [name] reports that she "can get mad in a heartbeat". She was never an angry person before, and it bothers her that she gets so angry now. Her sister in law agrees this has been a big change in personality. Per Jenny [name], her temper has been "out of control" at times, especially when she wasn't taking  her Zoloft. Jenny [name] does notice improvement in mood/anger with Zoloft. The patient also admits to sad/depressed mood and states she feels she is grieving "for who I was". She has had difficulty adjusting to end of life years, and accepting declining independence. She denies suicidal ideation or intention. She is not having difficulty sleeping. Appetite is reduced she  lost ~35 lbs unintentionally. Jenny [name] has been concerned about this and encourages her to eat meals even if she does not feel particularly hungry.  She has not had any hallucinations.   Social History: Born/Raised: [place]. The patient is Native American. 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 Marital history: Widowed since the 1990s. She has no children. Alcohol: Former alcoholic, sober for 48 years, continues to participate in [place] Tobacco: Former, per records   Medical History:  Past Medical History:  Diagnosis Date  . Fibromyalgia   . Hypertension   . Thyroid disease   . Urinary incontinence    stress incontinence per pt     Current medications:  Outpatient Encounter Medications as of 08/05/2018  Medication Sig  . CALCIUM CARBONATE-VITAMIN D PO Take by mouth. Calcium Carbonate 600 mg Vitamin D 400 mg in tablet  . Cholecalciferol (VITAMIN D3 PO) Take 2 capsules by mouth daily.  . ferrous fumarate (HEMOCYTE - 106 MG FE) 325 (106 FE) MG TABS tablet Take 1 tablet by mouth.  . levothyroxine (SYNTHROID, LEVOTHROID) 150 MCG tablet Take 150 mcg by mouth daily.  Marland Kitchen losartan (COZAAR) 100 MG tablet Take 50 mg by mouth daily.  Marland Kitchen OVER THE COUNTER MEDICATION vitamins  . sertraline (ZOLOFT) 100 MG tablet Take 100 mg by mouth daily.  . vitamin B-12 (CYANOCOBALAMIN) 1000 MCG tablet Take 2,000 mcg by mouth daily.    No facility-administered encounter medications on file as of 08/05/2018.      Current Examination:  Behavioral Observations:  Appearance: Neatly, casually and appropriately dressed and groomed Gait: Ambulated independently, no gross abnormalities observed Speech: Fluent; normal rate, rhythm and volume. No significant word finding difficulty during conversational speech and interview. She does repeat herself without awareness of having done  so. Thought process: Linear, goal directed Affect: Full, generally euthymic, appropriate to context Interpersonal: Pleasant, appropriate Orientation: Oriented to person, place, current year and current day of the week. Disoriented to month ("April") and date. Accurately named the current President but inaccurately named his immediate predecessor [employer]").  Tests Administered: . Test of Premorbid Functioning (TOPF) . Wechsler Adult Intelligence Scale-Fourth Edition (WAIS-IV): Similarities, Music therapist, Coding and Digit Span subtests . Wechsler Memory Scale-Fourth Edition (WMS-IV) Older Adult Version (ages 6-90): Logical Memory I, II and Recognition subtests  . [employer] Verbal Learning Test - 2nd Edition (CVLT-2) Short Form . Repeatable Battery for the Assessment of Neuropsychological Status (RBANS) Form A:  Figure Copy and Recall subtests and Semantic Fluency subtest . Boston Naming Test (BNT) . Boston Diagnostic Aphasia Examination: Complex Ideational Material subtest . Controlled Oral Word Association Test (COWAT) . Trail Making Test A and B . Clock drawing test . Beck Depression Inventory - 2nd Edition (BDI-II) . Generalized Anxiety Disorder - 7 item screener (GAD-7)  Test Results: Note: Standardized scores are presented only for use by appropriately trained professionals and to allow for any future test-retest comparison. These scores should not be interpreted without consideration of all the information that is contained in the rest of the  report. The most recent standardization samples from the test publisher or other sources were used whenever possible to derive standard scores; scores were corrected for age, gender, ethnicity and education when available.   Test Scores:  Test Name Raw Score Standardized Score Descriptor  TOPF 44/70 SS= 103 Average  WAIS-IV Subtests     Similarities 18/36 ss= 8 Low end of average  Block Design 33/66 ss= 12 High average  Coding 36/135 ss= 9  Average  Digit Span Forward 10/16 ss= 11 Average  Digit Span Backward 8/16 ss= 11 Average  WMS-IV Subtests     LM I 16/53 ss= 6 Low average  LM II 0/39 ss= 2 Impaired  LM II Recognition 14/23 Cum %: 10-16 Below expectation  RBANS Subtests     Figure Copy 17/20 Z= -0.2 Average  Figure Recall 3/20 Z= -2.1 Impaired  Semantic Fluency 11 Z= -1.7 Borderline  CVLT-II Scores     Trial 1 2/9 Z= -3 Severely impaired  Trial 4 4/9 Z= -2 Impaired  Trials 1-4 total 14/36 T= 24 Severely impaired  SD Free Recall 1/9 Z= -2.5 Impaired   LD Free Recall 1/9 Z= -2 Impaired  LD Cued Recall 1/9 Z= -3 Severely impaired  Recognition Hits 8/9 Z= -0.5 Average  Recognition False Positives 8 Z= -2.5 Impaired  Forced Choice Recognition 9/9  WNL  BNT 54/60 T= 55 Average  BDAE Complex Ideational Material 9/12  Impaired  COWAT-FAS 36 T= 49 Average  COWAT-Animals 11 T= 38 Low average  Trail Making Test A  74" 0 errors T= 45 Average  Trail Making Test B  Pt unable   Impaired  Clock Drawing   Impaired   BDI-II 10/63  WNL  GAD-7 2/21  WNL      Description of Test Results:  Premorbid verbal intellectual abilities were estimated to have been within the average range based on a test of word reading. Psychomotor processing speed was average. Auditory attention and working memory were average. Visual-spatial construction was average to high average. Language abilities were variable. Specifically, confrontation naming was average, and semantic verbal fluency was borderline to low average. Auditory comprehension of complex ideational material was impaired. With regard to verbal memory, encoding and acquisition of non-contextual information (i.e., word list) was severely impaired. After a brief distracter task, free recall was impaired (1/9 items). After a delay, free recall was impaired (1/9 items). Cued recall was severely impaired (1/9 items). Performance on a yes/no recognition task was impaired due to high number of  false positive errors. On another verbal memory test, encoding and acquisition of contextual auditory information (i.e., short stories) was low average. After a delay, free recall was impaired (recalled no information from either story). Performance on a yes/no recognition task was impaired. With regard to non-verbal memory, delayed free recall of visual information was impaired. Executive functioning was variable. Mental flexibility and set-shifting were impaired; she was unable to complete Tra[place] [address]. Verbal fluency with phonemic search restrictions was average. Verbal abstract reasoning was average. Performance on a clock drawing task was impaired. On a self-report measure of mood, the patient's responses were not indicative of clinically significant depression at the present time. Symptoms endorsed included: mild sadness, guilty feelings, loss of self confidence, feelings of worthlessness, loss of energy, irritability, reduced appetite, concentration difficulty, and fatigue. She reported more significant pessimism/hopelessness. She denied suicidal ideation or intention. On a self-report measure of anxiety, the patient did not endorse clinically significant generalized anxiety at the present time.  Clinical Impressions: Mild dementia most likely due to Alzheimer's disease. Results of cognitive testing revealed prominent deficits in learning and memory consolidation, as well as some aspects of executive functioning. Additionally, there is evidence that the patient's memory deficits are interfering with her ability to manage complex ADLs (e.g., driving, medications, appointments, finances). As such, diagnostic criteria for a dementia syndrome are met.  Her cognitive profile is suggestive of hippocampal consolidation dysfunction. Based on her cognitive profile and clinical features including onset and course of symptoms, Alzheimer's disease is considered the most likely etiology. I would characterize her  dementia as mild stage at this point. She does endorse mild, likely subclinical, symptoms of depression which I suspect are adjustment-related. I do not see evidence of a primary psychiatric disorder. Her sister in law has noticed some mood/behavior changes that are likely associated with underlying dementia.     Recommendations/Plan: Based on the findings of the present evaluation, the following recommendations are offered:  1. She requires assistance with all complex ADLs. She appears to have appropriate level of support and supervision at the present time. 2. She is at increased risk of financial exploitation and undue influence, given her memory impairment, and as such I agree with her POA handling her finances. 3. From a neuropsychological perspective, the patient appears to be an appropriate candidate for cholinesterase inhibitor therapy. She has apparently tried Aricept and Namenda in the past, and experienced some side effects. Her sister in law thinks that the patient may have been quick to stop the medications in the past. The patient is now interested in trying medication for AD again. She and her sister in law will follow up with Dr. Jaynee Eagles about this. 4. I provided information to the patient and her sister in law regarding the movement/dance study being done in [place] through [facility] for individuals with memory loss. 5. She was encouraged to continue social interaction and mental stimulation, and movement as able, for brain health. We also briefly discussed the MIND diet. 6. Dementia caregiver ducation and support is recommended for her sister in law and friends. 7. She is encouraged to maintain a regular schedule/routine to help optimize cognitive functioning. 8. Neurocognitive re-evaluation in 1-2 years is recommended in order to monitor cognitive status, track any progression of symptoms and further assist with treatment recommendations.   Feedback to Patient: Jenny [name] and her sister in law returned for a feedback appointment on 08/05/2018 to review the results of her neuropsychological evaluation with this provider. 35 minutes face-to-face time was spent reviewing her test results, my impressions and my recommendations as detailed above.    Total time spent on this patient's case: 80 minutes for neurobehavioral status exam with psychologist (CPT code [phone]); 90 minutes of testing/scoring by psychometrician under psychologist's supervision (CPT codes [phone], [phone] units); 180 minutes for integration of patient data, interpretation of standardized test results and clinical data, clinical decision making, treatment planning and preparation of this report, and interactive feedback with review of results to the patient/family by psychologist (CPT codes [phone], [phone] units).      Thank you for your referral of Jenny [name]. Please feel free to contact me if you have any questions or concerns regarding this report.

## 2018-08-05 ENCOUNTER — Encounter: Payer: Self-pay | Admitting: Psychology

## 2018-08-05 ENCOUNTER — Ambulatory Visit (INDEPENDENT_AMBULATORY_CARE_PROVIDER_SITE_OTHER): Payer: Medicare Other | Admitting: Psychology

## 2018-08-05 DIAGNOSIS — G301 Alzheimer's disease with late onset: Secondary | ICD-10-CM

## 2018-08-05 DIAGNOSIS — F0280 Dementia in other diseases classified elsewhere without behavioral disturbance: Secondary | ICD-10-CM | POA: Diagnosis not present

## 2018-08-11 ENCOUNTER — Telehealth: Payer: Self-pay | Admitting: Psychology

## 2018-08-11 NOTE — Telephone Encounter (Signed)
-----   Message from Tonny Branch sent at 08/11/2018  3:07 PM EDT ----- I spoke to [place] at [facility] and she said if Lois/POA could come by and sign release that would be great. I will contact [employer] [employer] and have her come by our office and do so.

## 2018-08-11 NOTE — Progress Notes (Signed)
Thank you :)

## 2018-08-11 NOTE — Telephone Encounter (Signed)
[name] called and states that the [facility] study that you told them about needs some information of how you came up with DX. They would like the information sent to Rogue Bussing at the I move study at Fax number [phone] and the phone number is [phone].

## 2018-09-29 ENCOUNTER — Encounter: Payer: Self-pay | Admitting: Neurology

## 2018-09-29 ENCOUNTER — Encounter

## 2018-09-29 ENCOUNTER — Ambulatory Visit: Payer: Medicare Other | Admitting: Neurology

## 2018-09-29 VITALS — BP 103/55 | HR 57 | Ht 65.5 in | Wt 143.0 lb

## 2018-09-29 DIAGNOSIS — G309 Alzheimer's disease, unspecified: Secondary | ICD-10-CM

## 2018-09-29 DIAGNOSIS — G3184 Mild cognitive impairment, so stated: Secondary | ICD-10-CM | POA: Diagnosis not present

## 2018-09-29 DIAGNOSIS — R413 Other amnesia: Secondary | ICD-10-CM | POA: Diagnosis not present

## 2018-09-29 DIAGNOSIS — F0670 Mild neurocognitive disorder due to known physiological condition without behavioral disturbance: Secondary | ICD-10-CM | POA: Insufficient documentation

## 2018-09-29 MED ORDER — DONEPEZIL HCL 10 MG PO TABS: ORAL_TABLET | ORAL | 11 refills | Status: DC

## 2018-09-29 NOTE — Progress Notes (Signed)
[facility]    Provider:  Dr Lucia GaskinsAhern Referring Provider: Farris HasMORROW, AARON, MD Primary Care Physician:  Farris HasMorrow, Aaron, MD  CC:  Memory loss  Interval history 09/29/2018: Here for follow up.  Patient was diagnosed with mild dementia most likely due to Alzheimer's disease.  See Dr. Philis FendtBailar-heath's assessment August 05, 2018.  Results of cognitive testing revealed prominent deficits in learning and memory consolidation as well as some aspects of executive functioning.  These deficits are interfering with her ability to manage complex ADLs including driving, medications, appointments, finances and fits the diagnostic criteria for dementia.  No evidence of a primary psychiatric disorder.  She tried Aricept and Namenda in the past and had side effects.  Discussed trying a low-dose of Aricept again.  Interval history 6/5/20019: She is here with a friend who provides much information. She is "really getting worse". She cna;t remember what she is doing, she forgets tasks, she forgets dates and appointments, writing things down, still endorses everything in previous note (see below) from 2 years ago but worsening. She lives in an apartment. She is forgetting to take her medication. She has aphasia as well per friend. She has a lot of friends. Appears to have good insight. Sh eneeds reminders from friends. Not driving anymore, because she got lost driving, if she is someplace unfamiliar she can get lost and gets panicked. Has difficulty learning new things. She has very good insight. Friend says she notices she is withdrawing, apathy, not caring to go places also socially she is withdrawing. Had side effects to Namenda.  Dizziness with Aricept.  HPI:  Jenny HeadingShirley [name] is a 84 y.o. female here as a referral from Dr. Kateri PlummerMorrow for memory loss. PMHx HTN, Fibromyalgia. She doesn't remember people's names. Even people who she has known for years. Has been ongoing for at least 3 years. Has a friend here who  provides much information. She has been sober for 46 years. 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 She has to write down appointments. She needs help with finances and bills sometimes, her friend helps her. She lives in the [facility]. In the last 3-4 months memory loss has sped up. Sister with Alzheimers. Mother fine. 3 weeks ago she was feeling dizzy, she thought it was vertigo, she couldn;t remember how to get some pages in order for a presentation for AA. She has some depression as well and she is fearful about Alzheimers. [facility] month and will be following there. No other associated symptoms or modifying factors. Wory and stress is making her symptoms worse. Nothing makes them better and she can;t identify any triggers or inciting events, denies head trauma or previous illnesses. No other focal neurologic deficits or complaints.  Reviewed notes, labs and imaging from outside physicians, which showed:   B12 238, TSH .03  Personally reviewed MRI images 06/2016 and agree with the following:  FINDINGS: No evidence for acute infarction, hemorrhage, mass lesion, hydrocephalus, or extra-axial fluid. Global atrophy. Chronic microvascular ischemic change.  Flow voids are maintained. Small foci of chronic hemorrhage in the supratentorial subcortical white matter, likely sequelae of hypertensive cerebrovascular disease.  Pituitary, pineal, and cerebellar tonsils unremarkable. No upper cervical cord lesions. Prior cervical fusion.  Visualized calvarium, skull base, and upper cervical osseous structures unremarkable. Scalp and extracranial soft tissues, orbits, sinuses, and mastoids  show no acute process.  Post infusion, no abnormal enhancement of the brain or meninges. Major dural venous sinuses are patent.  Compared with 2008, there is  progression of both atrophy and chronic microvascular ischemic change.  IMPRESSION: Chronic changes as described. No acute intracranial abnormality. No abnormal postcontrast enhancement.  No acute cause is seen for the reported symptoms.   Review of Systems: Patient complains of symptoms per HPI as well as the following symptoms:joint pain, aching muscles, disinterest in activities . Pertinent negatives per HPI. All others negative.   Social History   Socioeconomic History  . Marital status: Widowed    Spouse name: Not on file  . Number of children: 0  . Years of education: 41  . Highest education level: Not on file  Occupational History  . Occupation: Retired  Engineer, production  . Financial resource strain: Not on file  . Food insecurity:    Worry: Not on file    Inability: Not on file  . Transportation needs:    Medical: Not on file    Non-medical: Not on file  Tobacco Use  . Smoking status: Former Games developer  . Smokeless tobacco: Never Used  Substance and Sexual Activity  . Alcohol use: No  . Drug use: No  . Sexual activity: Not on file  Lifestyle  . Physical activity:    Days per week: Not on file    Minutes per session: Not on file  . Stress: Not on file  Relationships  . Social connections:    Talks on phone: Not on file    Gets together: Not on file    Attends religious service: Not on file    Active member of club or organization: Not on file    Attends meetings of clubs or organizations: Not on file    Relationship status: Not on file  . Intimate partner violence:    Fear of current or ex partner: Not on file    Emotionally abused: Not on file    Physically abused: Not on file    Forced sexual activity: Not on file  Other Topics Concern  . Not on file  Social History Narrative   Lives alone   Caffeine use: sometimes   Right handed    Family History  Problem Relation Age of Onset  . Pneumonia Mother   . Heart Problems Father   . Alzheimer's  disease Sister   . Heart Problems Other     Past Medical History:  Diagnosis Date  . Fibromyalgia   . Hypertension   . Thyroid disease   . Urinary incontinence    stress incontinence per pt     Past Surgical History:  Procedure Laterality Date  . APPENDECTOMY    . CATARACT EXTRACTION Right   . SALPINGOOPHORECTOMY    . VAGINAL HYSTERECTOMY      Current Outpatient Medications  Medication Sig Dispense Refill  . CALCIUM CARBONATE-VITAMIN D PO Take by mouth. Calcium Carbonate 600 mg Vitamin D 400 mg in tablet    . Cholecalciferol (VITAMIN D3 PO) Take 2 capsules by mouth daily.    . ferrous fumarate (HEMOCYTE - 106 MG FE) 325 (106 FE) MG TABS tablet Take 1 tablet by mouth.    . levothyroxine (SYNTHROID, LEVOTHROID) 150 MCG tablet Take 150 mcg by mouth daily.    Marland Kitchen losartan (COZAAR) 100 MG tablet Take 50 mg by mouth daily.    Marland Kitchen OVER THE COUNTER MEDICATION vitamins    . sertraline (ZOLOFT) 100  MG tablet Take 100 mg by mouth daily.    . vitamin B-12 (CYANOCOBALAMIN) 1000 MCG tablet Take 2,000 mcg by mouth daily.      No current facility-administered medications for this visit.     Allergies as of 09/29/2018 - Review Complete 08/05/2018  Allergen Reaction Noted  . Aricept [donepezil]  03/26/2018  . Benazepril Cough 03/26/2018  . Codeine Hives 03/29/2011  . Namenda [memantine]  03/26/2018  . Statins Other (See Comments) 03/29/2011  . Sulfa antibiotics Hives and Nausea Only 03/29/2011  . Adhesive [tape] Rash 07/19/2016    Vitals: There were no vitals taken for this visit. Last Weight:  Wt Readings from Last 1 Encounters:  03/26/18 147 lb (66.7 kg)   Last Height:   Ht Readings from Last 1 Encounters:  03/26/18 5' 5.5" (1.664 m)    Physical exam: Exam: Gen: NAD, conversant, well nourised, obese, well groomed                     CV: RRR, no MRG. No Carotid Bruits. No peripheral edema, warm, nontender Eyes: Conjunctivae clear without exudates or  hemorrhage  Neuro: Detailed Neurologic Exam  Speech:    Speech is normal; fluent and spontaneous with normal comprehension.  Cognition:  MMSE - Mini Mental State Exam 03/26/2018 07/19/2016  Orientation to time 2 4  Orientation to Place 5 5  Registration 3 3  Attention/ Calculation 5 5  Recall 3 3  Language- name 2 objects 2 2  Language- repeat 1 1  Language- follow 3 step command 3 3  Language- read & follow direction 1 1  Write a sentence 1 1  Copy design 1 1  Total score 27 29      The patient is oriented to person, place, and time;     recent and remote memory intact;     language fluent;     normal attention, concentration,     fund of knowledge Cranial Nerves:    The pupils are equal, round, and reactive to light. Attempted fundoscopic exam could not visualize due to small pupils. . Visual fields are full to finger confrontation. Extraocular movements are intact. Trigeminal sensation is intact and the muscles of mastication are normal. The face is symmetric. The palate elevates in the midline. Hearing intact. Voice is normal. Shoulder shrug is normal. The tongue has normal motion without fasciculations.   Coordination:    Normal finger to nose and heel to shin. Normal rapid alternating movements.   Gait:    Heel-toe and tandem gait are normal.   Motor Observation:    No asymmetry, no atrophy, and no involuntary movements noted. Tone:    Normal muscle tone.    Posture:    Posture is normal. normal erect    Strength:    Strength is V/V in the upper and lower limbs.      Sensation: intact to LT     Reflex Exam:  DTR's:    Deep tendon reflexes in the upper and lower extremities are normal bilaterally.   Toes:    The toes are downgoing bilaterally.   Clonus:    Clonus is absent.      Assessment/Plan:  84 year old female here for cognitive complaints, sister with Alzheimre's and she is very fearful about memory loss. Discussed dementia, different types of  dementia as well as normal cognitive aging. Gave resources for Alzheimer's foundation and alzheimers association. MRI of the brain was normal for age. MMSE 29/30  now 27/30 and she feels her cognition is worsening, here with a friend who also provides much information.  -  Patient was diagnosed with mild dementia most likely due to Alzheimer's disease via formal neuropsych testing - Discussed clincial trials. She is involved in a trial at [facility]. - She did not tolerate Aricept or Namenda, can try aricept again low dose. She says she really didn;t give Aricept a good trial. Discussed other cardiac and other side effects stop for anything concerning.  - in 3 months follow up and can start namenda.  - aricept can sometimes decrease heart rate. Check it daily while starting Ariept and for any side effects stop.   Cc: Farris Has, MD  Naomie Dean, MD  [facility] [address] [place], [place] [address]  Phone [phone] Fax [phone]  A total of 25 minutes was spent face-to-face with this patient. Over half this time was spent on counseling patient on the dementia diagnosis and different diagnostic and therapeutic options, counseling and coordination of care, risks ans benefits of management, compliance, or risk factor reduction and education.

## 2018-09-29 NOTE — Patient Instructions (Signed)
Donepezil tablets What is this medicine? DONEPEZIL (doe NEP e zil) is used to treat mild to moderate dementia caused by Alzheimer's disease. This medicine may be used for other purposes; ask your health care provider or pharmacist if you have questions. COMMON BRAND NAME(S): Aricept What should I tell my health care provider before I take this medicine? They need to know if you have any of these conditions: -asthma or other lung disease -difficulty passing urine -head injury -heart disease -history of irregular heartbeat -liver disease -seizures (convulsions) -stomach or intestinal disease, ulcers or stomach bleeding -an unusual or allergic reaction to donepezil, other medicines, foods, dyes, or preservatives -pregnant or trying to get pregnant -breast-feeding How should I use this medicine? Take this medicine by mouth with a glass of water. Follow the directions on the prescription label. You may take this medicine with or without food. Take this medicine at regular intervals. This medicine is usually taken before bedtime. Do not take it more often than directed. Continue to take your medicine even if you feel better. Do not stop taking except on your doctor's advice. If you are taking the 23 mg donepezil tablet, swallow it whole; do not cut, crush, or chew it. Talk to your pediatrician regarding the use of this medicine in children. Special care may be needed. Overdosage: If you think you have taken too much of this medicine contact a poison control center or emergency room at once. NOTE: This medicine is only for you. Do not share this medicine with others. What if I miss a dose? If you miss a dose, take it as soon as you can. If it is almost time for your next dose, take only that dose, do not take double or extra doses. What may interact with this medicine? Do not take this medicine with any of the following medications: -certain medicines for fungal infections like itraconazole,  fluconazole, posaconazole, and voriconazole -cisapride -dextromethorphan; quinidine -dofetilide -dronedarone -pimozide -quinidine -thioridazine -ziprasidone This medicine may also interact with the following medications: -antihistamines for allergy, cough and cold -atropine -bethanechol -carbamazepine -certain medicines for bladder problems like oxybutynin, tolterodine -certain medicines for Parkinson's disease like benztropine, trihexyphenidyl -certain medicines for stomach problems like dicyclomine, hyoscyamine -certain medicines for travel sickness like scopolamine -dexamethasone -ipratropium -NSAIDs, medicines for pain and inflammation, like ibuprofen or naproxen -other medicines for Alzheimer's disease -other medicines that prolong the QT interval (cause an abnormal heart rhythm) -phenobarbital -phenytoin -rifampin, rifabutin or rifapentine This list may not describe all possible interactions. Give your health care provider a list of all the medicines, herbs, non-prescription drugs, or dietary supplements you use. Also tell them if you smoke, drink alcohol, or use illegal drugs. Some items may interact with your medicine. What should I watch for while using this medicine? Visit your doctor or health care professional for regular checks on your progress. Check with your doctor or health care professional if your symptoms do not get better or if they get worse. You may get drowsy or dizzy. Do not drive, use machinery, or do anything that needs mental alertness until you know how this drug affects you. What side effects may I notice from receiving this medicine? Side effects that you should report to your doctor or health care professional as soon as possible: -allergic reactions like skin rash, itching or hives, swelling of the face, lips, or tongue -feeling faint or lightheaded, falls -loss of bladder control -seizures -signs and symptoms of a dangerous change in heartbeat or  heart   rhythm like chest pain; dizziness; fast or irregular heartbeat; palpitations; feeling faint or lightheaded, falls; breathing problems -signs and symptoms of infection like fever or chills; cough; sore throat; pain or trouble passing urine -signs and symptoms of liver injury like dark yellow or brown urine; general ill feeling or flu-like symptoms; light-colored stools; loss of appetite; nausea; right upper belly pain; unusually weak or tired; yellowing of the eyes or skin -slow heartbeat or palpitations -unusual bleeding or bruising -vomiting Side effects that usually do not require medical attention (report to your doctor or health care professional if they continue or are bothersome): -diarrhea, especially when starting treatment -headache -loss of appetite -muscle cramps -nausea -stomach upset This list may not describe all possible side effects. Call your doctor for medical advice about side effects. You may report side effects to FDA at 1-800-FDA-1088. Where should I keep my medicine? Keep out of reach of children. Store at room temperature between 15 and 30 degrees C (59 and 86 degrees F). Throw away any unused medicine after the expiration date. NOTE: This sheet is a summary. It may not cover all possible information. If you have questions about this medicine, talk to your doctor, pharmacist, or health care provider.  2018 Elsevier/Gold Standard (2016-03-26 21:00:42)  

## 2018-10-27 ENCOUNTER — Other Ambulatory Visit: Payer: Self-pay | Admitting: Family Medicine

## 2018-10-27 ENCOUNTER — Ambulatory Visit
Admission: RE | Admit: 2018-10-27 | Discharge: 2018-10-27 | Disposition: A | Discharge status: Home / Self Care | Payer: Medicare Other | Source: Ambulatory Visit | Attending: Family Medicine | Admitting: Family Medicine

## 2018-10-27 DIAGNOSIS — M533 Sacrococcygeal disorders, not elsewhere classified: Secondary | ICD-10-CM

## 2018-12-23 ENCOUNTER — Encounter: Payer: Self-pay | Admitting: Neurology

## 2018-12-23 ENCOUNTER — Ambulatory Visit: Payer: Medicare Other | Admitting: Neurology

## 2018-12-23 VITALS — BP 103/50 | HR 75 | Ht 65.5 in | Wt 138.0 lb

## 2018-12-23 DIAGNOSIS — G3184 Mild cognitive impairment, so stated: Secondary | ICD-10-CM | POA: Diagnosis not present

## 2018-12-23 DIAGNOSIS — R21 Rash and other nonspecific skin eruption: Secondary | ICD-10-CM | POA: Diagnosis not present

## 2018-12-23 DIAGNOSIS — G309 Alzheimer's disease, unspecified: Secondary | ICD-10-CM | POA: Diagnosis not present

## 2018-12-23 DIAGNOSIS — F0670 Mild neurocognitive disorder due to known physiological condition without behavioral disturbance: Secondary | ICD-10-CM

## 2018-12-23 MED ORDER — MEMANTINE HCL 10 MG PO TABS: ORAL_TABLET | ORAL | 4 refills | Status: DC

## 2018-12-23 MED ORDER — TRIAMCINOLONE ACETONIDE 0.1 % EX CREA: 1.0000 "application " | TOPICAL_CREAM | CUTANEOUS | 6 refills | Status: DC | PRN

## 2018-12-23 MED ORDER — TRIAMCINOLONE ACETONIDE 0.1 % EX CREA: 1.0000 "application " | TOPICAL_CREAM | CUTANEOUS | 6 refills | Status: AC | PRN

## 2018-12-23 NOTE — Patient Instructions (Signed)
Start with 10mg  daily (1 pill) in the morning. If no side effects can increase to twice daily. Stop for any side effects.  Memantine Tablets What is this medicine? MEMANTINE (MEM an teen) is used to treat dementia caused by Alzheimer's disease. This medicine may be used for other purposes; ask your health care provider or pharmacist if you have questions. COMMON BRAND NAME(S): Namenda What should I tell my health care provider before I take this medicine? They need to know if you have any of these conditions: -difficulty passing urine -kidney disease -liver disease -seizures -an unusual or allergic reaction to memantine, other medicines, foods, dyes, or preservatives -pregnant or trying to get pregnant -breast-feeding How should I use this medicine? Take this medicine by mouth with a glass of water. Follow the directions on the prescription label. You may take this medicine with or without food. Take your doses at regular intervals. Do not take your medicine more often than directed. Continue to take your medicine even if you feel better. Do not stop taking except on the advice of your doctor or health care professional. Talk to your pediatrician regarding the use of this medicine in children. Special care may be needed. Overdosage: If you think you have taken too much of this medicine contact a poison control center or emergency room at once. NOTE: This medicine is only for you. Do not share this medicine with others. What if I miss a dose? If you miss a dose, take it as soon as you can. If it is almost time for your next dose, take only that dose. Do not take double or extra doses. If you do not take your medicine for several days, contact your health care provider. Your dose may need to be changed. What may interact with this  medicine? -acetazolamide -amantadine -cimetidine -dextromethorphan -dofetilide -hydrochlorothiazide -ketamine -metformin -methazolamide -quinidine -ranitidine -sodium bicarbonate -triamterene This list may not describe all possible interactions. Give your health care provider a list of all the medicines, herbs, non-prescription drugs, or dietary supplements you use. Also tell them if you smoke, drink alcohol, or use illegal drugs. Some items may interact with your medicine. What should I watch for while using this medicine? Visit your doctor or health care professional for regular checks on your progress. Check with your doctor or health care professional if there is no improvement in your symptoms or if they get worse. You may get drowsy or dizzy. Do not drive, use machinery, or do anything that needs mental alertness until you know how this drug affects you. Do not stand or sit up quickly, especially if you are an older patient. This reduces the risk of dizzy or fainting spells. Alcohol can make you more drowsy and dizzy. Avoid alcoholic drinks. What side effects may I notice from receiving this medicine? Side effects that you should report to your doctor or health care professional as soon as possible: -allergic reactions like skin rash, itching or hives, swelling of the face, lips, or tongue -agitation or a feeling of restlessness -depressed mood -dizziness -hallucinations -redness, blistering, peeling or loosening of the skin, including inside the mouth -seizures -vomiting Side effects that usually do not require medical attention (report to your doctor or health care professional if they continue or are bothersome): -constipation -diarrhea -headache -nausea -trouble sleeping This list may not describe all possible side effects. Call your doctor for medical advice about side effects. You may report side effects to FDA at 1-800-FDA-1088. Where should I keep my  medicine? Keep  out of the reach of children. Store at room temperature between 15 degrees and 30 degrees C (59 degrees and 86 degrees F). Throw away any unused medicine after the expiration date. NOTE: This sheet is a summary. It may not cover all possible information. If you have questions about this medicine, talk to your doctor, pharmacist, or health care provider.  2019 Elsevier/Gold Standard (2013-07-27 14:10:42)

## 2018-12-23 NOTE — Progress Notes (Addendum)
[facility]    Provider:  Dr Lucia [name] Referring Provider: Farris Has, [name] Primary Care Physician:  Jenny [name]  CC:  Memory loss  Interval history 12/23/2018: She is having difficulty with medications, but now her sister-in-law will be following more closely. She forgets to take her medications and this was for 3 days in a row. She is tolerating the Aricept. She [name] a positive approach. They will check in and make phone calls. She gets upset that this is happening. The family is helping with automatic deposits and payents, she [name] a POA. She is not cooking, feels she is doing well, she [name] a lot of support.   Interval history 09/29/2018: Here for follow up.  Patient was diagnosed with mild dementia most likely due to Alzheimer's disease.  See Jenny. Philis [name] assessment August 05, 2018.  Results of cognitive testing revealed prominent deficits in learning and memory consolidation as well as some aspects of executive functioning.  These deficits are interfering with her ability to manage complex ADLs including driving, medications, appointments, finances and fits the diagnostic criteria for dementia.  No evidence of a primary psychiatric disorder.  She tried Aricept and Namenda in the past and had side effects.  Discussed trying a low-dose of Aricept again.  Interval history 6/5/20019: She is here with a friend who provides much information. She is "really getting worse". She cna;t remember what she is doing, she forgets tasks, she forgets dates and appointments, writing things down, still endorses everything in previous note (see below) from 2 years ago but worsening. She lives in an apartment. She is forgetting to take her medication. She [name] aphasia as well per friend. She [name] a lot of friends. Appears to have good insight. Sh eneeds reminders from friends. Not driving anymore, because she got lost driving, if she is someplace unfamiliar she can get lost and gets panicked.  [name] difficulty learning new things. She [name] very good insight. Friend says she notices she is withdrawing, apathy, not caring to go places also socially she is withdrawing. Had side effects to Namenda.  Dizziness with Aricept.  HPI:  Jenny [name] is a 85 y.o. female here as a referral from Jenny. Kateri [name] for memory loss. PMHx HTN, Fibromyalgia. She doesn't remember people's names. Even people who she [name] known for years. [name] been ongoing for at least 3 years. [name] a friend here who provides much information. 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 She [name] to write down appointments. She needs help with finances and bills sometimes, her friend helps her. She lives in the [facility] independent [facility]. In the last 3-4 months memory loss [name] sped up. Sister with Alzheimers. Mother fine. 3 weeks ago she was feeling dizzy, she thought it was vertigo, she couldn;t remember how to get some pages in order for a presentation for AA. She [name] some depression as well and she is fearful about Alzheimers. She [name] an appt at [facility] for a dementia trial next month and will be following there. No other associated symptoms or modifying factors. Wory and stress is making her symptoms worse. Nothing makes them better and she can;t identify any triggers or inciting events, denies head trauma or previous illnesses. No other focal neurologic deficits or complaints.  Reviewed notes, labs and imaging from outside physicians, which showed:   B12 238, TSH .03  Personally reviewed MRI images 06/2016 and agree with the following:  FINDINGS: No evidence for acute infarction, hemorrhage, mass lesion, hydrocephalus, or extra-axial fluid. Global atrophy. Chronic microvascular ischemic change.  Flow voids are maintained. Small foci of chronic hemorrhage in the supratentorial subcortical white matter, likely sequelae  of hypertensive cerebrovascular disease.  Pituitary, pineal, and cerebellar tonsils unremarkable. No upper cervical cord lesions. Prior cervical fusion.  Visualized calvarium, skull base, and upper cervical osseous structures unremarkable. Scalp and extracranial soft tissues, orbits, sinuses, and mastoids show no acute process.  Post infusion, no abnormal enhancement of the brain or meninges. Major dural venous sinuses are patent.  Compared with 2008, there is progression of both atrophy and chronic microvascular ischemic change.  IMPRESSION: Chronic changes as described. No acute intracranial abnormality. No abnormal postcontrast enhancement.  No acute cause is seen for the reported symptoms.   Review of Systems: Patient complains of symptoms per HPI as well as the following symptoms:joint pain, aching muscles, disinterest in activities . Pertinent negatives per HPI. All others negative.   Social History   Socioeconomic History  . Marital status: Widowed    Spouse name: Not on file  . Number of children: 0  . Years of education: 56  . Highest education level: Not on file  Occupational History  . Occupation: Retired  Engineer, production  . Financial resource strain: Not on file  . Food insecurity:    Worry: Not on file    Inability: Not on file  . Transportation needs:    Medical: Not on file    Non-medical: Not on file  Tobacco Use  . Smoking status: Former Games developer  . Smokeless tobacco: Never Used  Substance and Sexual Activity  . Alcohol use: No  . Drug use: No  . Sexual activity: Not on file  Lifestyle  . Physical activity:    Days per week: Not on file    Minutes per session: Not on file  . Stress: Not on file  Relationships  . Social connections:    Talks on phone: Not on file    Gets together: Not on file    Attends religious service: Not on file    Active member of club or organization: Not on file    Attends meetings of clubs or organizations: Not  on file    Relationship status: Not on file  . Intimate partner violence:    Fear of current or ex partner: Not on file    Emotionally abused: Not on file    Physically abused: Not on file    Forced sexual activity: Not on file  Other Topics Concern  . Not on file  Social History Narrative   Lives alone   Caffeine use: sometimes   Right handed    Family History  Problem Relation Age of Onset  . Pneumonia Mother   . Heart Problems Father   . Alzheimer's disease Sister   . Heart Problems Other   . Heart Problems Sister     Past Medical History:  Diagnosis Date  . B12 deficiency   . Back pain   . Dementia due to Alzheimer's disease (HCC)   . Dysphagia   . Fibromyalgia   . Hypertension   . Hypothyroidism   . Lung nodule   . Memory changes   . Moderate depressive episode (HCC)   . Osteopenia   . Pharyngoesophageal dysphagia   . Thyroid disease   . Urinary incontinence    stress incontinence per pt   .  Vitamin D deficiency   . Weight loss     Past Surgical History:  Procedure Laterality Date  . APPENDECTOMY    . BACK SURGERY     lumbar   . CATARACT EXTRACTION Right   . SALPINGOOPHORECTOMY    . VAGINAL HYSTERECTOMY      Current Outpatient Medications  Medication Sig Dispense Refill  . CALCIUM PO Take by mouth.    . Cholecalciferol (VITAMIN D3 PO) Take 2,000 Units by mouth daily.     Marland Kitchen donepezil (ARICEPT) 10 MG tablet Start with 1/2 tablet for 2 weeks. Then increase to a whole tablet. 30 tablet 11  . ferrous sulfate 325 (65 FE) MG tablet Take 325 mg by mouth daily with breakfast.    . levothyroxine (SYNTHROID, LEVOTHROID) 200 MCG tablet Take 200 mcg by mouth daily before breakfast.    . losartan (COZAAR) 100 MG tablet Take 50 mg by mouth daily.    . Multiple Vitamins-Minerals (MULTIVITAMIN PO) Take by mouth.    Marland Kitchen OVER THE COUNTER MEDICATION vitamins    . sertraline (ZOLOFT) 100 MG tablet Take 100 mg by mouth daily.    . vitamin B-12 (CYANOCOBALAMIN) 1000 MCG  tablet Take 1,000 mcg by mouth daily.     . memantine (NAMENDA) 10 MG tablet Start with one tablet once daily. If no side effects increase to twice daily. 180 tablet 4  . triamcinolone cream (KENALOG) 0.1 % Apply 1 application topically as needed. 80 g 6   No current facility-administered medications for this visit.     Allergies as of 12/23/2018 - Review Complete 12/23/2018  Allergen Reaction Noted  . Benazepril Cough 03/26/2018  . Codeine Hives 03/29/2011  . Namenda [memantine]  03/26/2018  . Statins Other (See Comments) 03/29/2011  . Sulfa antibiotics Hives and Nausea Only 03/29/2011  . Adhesive [tape] Rash 07/19/2016    Vitals: BP (!) 103/50 (BP Location: Right Arm, Patient Position: Sitting)   Pulse 75   Ht 5' 5.5" (1.664 m)   Wt 138 lb (62.6 kg)   BMI 22.62 kg/m  Last Weight:  Wt Readings from Last 1 Encounters:  12/23/18 138 lb (62.6 kg)   Last Height:   Ht Readings from Last 1 Encounters:  12/23/18 5' 5.5" (1.664 m)    Physical exam: Exam: Gen: NAD, conversant, well nourised, obese, well groomed                     CV: RRR, no MRG. No Carotid Bruits. No peripheral edema, warm, nontender Eyes: Conjunctivae clear without exudates or hemorrhage  Neuro: Detailed Neurologic Exam  Speech:    Speech is normal; fluent and spontaneous with normal comprehension.  Cognition:  MMSE - Mini Mental State Exam 03/26/2018 07/19/2016  Orientation to time 2 4  Orientation to Place 5 5  Registration 3 3  Attention/ Calculation 5 5  Recall 3 3  Language- name 2 objects 2 2  Language- repeat 1 1  Language- follow 3 step command 3 3  Language- read & follow direction 1 1  Write a sentence 1 1  Copy design 1 1  Total score 27 29      The patient is oriented to person, place, and time;     recent and remote memory intact;     language fluent;     normal attention, concentration,     fund of knowledge Cranial Nerves:    The pupils are equal, round, and reactive to light.  Attempted fundoscopic  exam could not visualize due to small pupils. . Visual fields are full to finger confrontation. Extraocular movements are intact. Trigeminal sensation is intact and the muscles of mastication are normal. The face is symmetric. The palate elevates in the midline. Hearing intact. Voice is normal. Shoulder shrug is normal. The tongue [name] normal motion without fasciculations.   Coordination:    Normal finger to nose and heel to shin. Normal rapid alternating movements.   Gait:    Heel-toe and tandem gait are normal.   Motor Observation:    No asymmetry, no atrophy, and no involuntary movements noted. Tone:    Normal muscle tone.    Posture:    Posture is normal. normal erect    Strength:    Strength is V/V in the upper and lower limbs.      Sensation: intact to LT     Reflex Exam:  DTR's:    Deep tendon reflexes in the upper and lower extremities are normal bilaterally.   Toes:    The toes are downgoing bilaterally.   Clonus:    Clonus is absent.      Assessment/Plan:  85 year old female here for cognitive complaints, sister with Alzheimre's and she is very fearful about memory loss. Discussed dementia, different types of dementia as well as normal cognitive aging. Gave resources for Alzheimer's foundation and alzheimers association. MRI of the brain was normal for age. MMSE 29/30 now 27/30 and she feels her cognition is worsening, here with a friend who also provides much information.  - Scheduled an hour next appointment would like MMSE and MoCA - doing well on Aricept, retry Namenda - rash - topical corticosteroid -  Patient was diagnosed with mild dementia most likely due to Alzheimer's disease via formal neuropsych testing - Discussed clincial trials. She is involved in a trial at [facility]. - She did not tolerate Aricept or Namenda in the past, tried aricept again low dose. She says she really didn;t give Aricept a good trial. Discussed other cardiac  and other side effects stop for anything concerning.  - in 6 months follow up - aricept can sometimes decrease heart rate. Check it daily while starting Ariept and for any side effects stop.   Meds ordered this encounter  Medications  . DISCONTD: memantine (NAMENDA) 10 MG tablet    Sig: Start with one tablet once daily. If no side effects increase to twice daily.    Dispense:  180 tablet    Refill:  4  . DISCONTD: triamcinolone cream (KENALOG) 0.1 %    Sig: Apply 1 application topically as needed.    Dispense:  80 g    Refill:  6  . memantine (NAMENDA) 10 MG tablet    Sig: Start with one tablet once daily. If no side effects increase to twice daily.    Dispense:  180 tablet    Refill:  4  . triamcinolone cream (KENALOG) 0.1 %    Sig: Apply 1 application topically as needed.    Dispense:  80 g    Refill:  6     Cc: Jenny [name]  Naomie Dean, [name]  [facility] [address] [place], [place] [address]  Phone [phone] Fax [phone]  A total of 25 minutes was spent face-to-face with this patient. Over half this time was spent on counseling patient on  1. Mild neurocognitive disorder due to Alzheimer's disease (HCC)   2. Rash     diagnosis and  different diagnostic and therapeutic options, counseling and coordination of care, risks ans benefits of management, compliance, or risk factor reduction and education.

## 2018-12-29 ENCOUNTER — Ambulatory Visit: Payer: Medicare Other | Admitting: Neurology

## 2019-03-04 ENCOUNTER — Ambulatory Visit: Payer: Medicare Other | Admitting: Orthopedic Surgery

## 2019-03-05 ENCOUNTER — Ambulatory Visit: Payer: Medicare Other | Admitting: Orthopedic Surgery

## 2019-03-05 ENCOUNTER — Ambulatory Visit: Payer: Medicare Other

## 2019-03-05 ENCOUNTER — Other Ambulatory Visit: Payer: Self-pay

## 2019-03-05 ENCOUNTER — Ambulatory Visit: Payer: Self-pay

## 2019-03-05 ENCOUNTER — Encounter: Payer: Self-pay | Admitting: Orthopedic Surgery

## 2019-03-05 DIAGNOSIS — M545 Low back pain, unspecified: Secondary | ICD-10-CM

## 2019-03-05 DIAGNOSIS — M25511 Pain in right shoulder: Secondary | ICD-10-CM

## 2019-03-05 NOTE — Progress Notes (Signed)
Office Visit Note   Patient: Jenny [name]           Date of Birth: 03/21/1932           MRN: [id number] Visit Date: 03/05/2019 Requested by: Farris HasMorrow, Aaron, MD [address] [place], [place] [address] PCP: Farris HasMorrow, Aaron, MD  Subjective: Chief Complaint  Patient presents with  . Right Shoulder - Pain    HPI: Jenny [name] is an 85 year old patient had a fall 3 months ago.  She is here with her sister.  Patient does have early onset dementia.  She reports that her right shoulder is been painful since the fall.  She has been trying to "rehab" her shoulder at home.  She describes some weakness and pain in that right shoulder.  She does have a history of prior lumbar spine surgery.  She is taking some over-the-counter medication.  Currently she is living with her sister which is a new arrangement.              ROS: All systems reviewed are negative as they relate to the chief complaint within the history of present illness.  Patient denies  fevers or chills.   Assessment & Plan: Visit Diagnoses:  1. Acute pain of right shoulder   2. Acute low back pain, unspecified back pain laterality, unspecified whether sciatica present     Plan: Impression is right shoulder pain with possible small rotator cuff tear following a fall.  More concerning is her lack of range of motion above 90 degrees of forward flexion and abduction.  Plan would be right shoulder subacromial injection as well as home exercise program.  Come back in 6 weeks and will decide for or against another injection and formal physical therapy.  Follow-Up Instructions: Return in about 6 weeks (around 04/16/2019).   Orders:  Orders Placed This Encounter  Procedures  . XR Shoulder Right  . XR Lumbar Spine 2-3 Views   No orders of the defined types were placed in this encounter.     Procedures: No procedures performed   Clinical Data: No additional findings.  Objective: Vital Signs: There were no vitals taken  for this visit.  Physical Exam:   Constitutional: Patient appears well-developed HEENT:  Head: Normocephalic Eyes:EOM are normal Neck: Normal range of motion Cardiovascular: Normal rate Pulmonary/chest: Effort normal Neurologic: Patient is alert Skin: Skin is warm Psychiatric: Patient has normal mood and affect    Ortho Exam: Ortho exam demonstrates pretty normal gait alignment.  She has fairly reasonable rotator cuff strength isolated infraspinatus supraspinatus and subscap muscle testing on both sides.  Shoulder has pain with forward flexion abduction above 90 and some restriction of passive motion in that regard.  Interestingly at 15 degrees of abduction her external rotation is pretty symmetric between shoulders.  Subscap and infraspinatus and supraspinatus strength is fairly symmetric between shoulders.  She has mild tenderness to palpation around the acromion on the right.  Patient does not have any real nerve root tension signs and no muscle atrophy in the legs.  Specialty Comments:  No specialty comments available.  Imaging: Xr Lumbar Spine 2-3 Views  Result Date: 03/05/2019 AP lateral lumbar spine reviewed.  Hardware present in the lower lumbar spine.  Some degenerative changes noted in the lower lumbar spine but no spinal listhesis or acute compression fractures.  Xr Shoulder Right  Result Date: 03/05/2019 AP lateral outlet right shoulder reviewed.  No acute fracture dislocation is present.  Shoulder is located.  Visualized lung fields clear.  Acromiohumeral distance normal.  Normal right shoulder    PMFS History: Patient Active Problem List   Diagnosis Date Noted  . 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 Cognitive changes 07/20/2016  . Left foot pain 03/29/2011  . Plantar fasciitis 03/29/2011   Past Medical History:  Diagnosis Date  . B12 deficiency   . Back pain   . Dementia due to Alzheimer's disease (HCC)   . Dysphagia    . Fibromyalgia   . Hypertension   . Hypothyroidism   . Lung nodule   . Memory changes   . Moderate depressive episode (HCC)   . Osteopenia   . Pharyngoesophageal dysphagia   . Thyroid disease   . Urinary incontinence    stress incontinence per pt   . Vitamin D deficiency   . Weight loss     Family History  Problem Relation Age of Onset  . Pneumonia Mother   . Heart Problems Father   . Alzheimer's disease Sister   . Heart Problems Other   . Heart Problems Sister     Past Surgical History:  Procedure Laterality Date  . APPENDECTOMY    . BACK SURGERY     lumbar   . CATARACT EXTRACTION Right   . SALPINGOOPHORECTOMY    . VAGINAL HYSTERECTOMY     Social History   Occupational History  . Occupation: Retired  Tobacco Use  . Smoking status: Former Games developer  . Smokeless tobacco: Never Used  Substance and Sexual Activity  . Alcohol use: No  . Drug use: No  . Sexual activity: Not on file

## 2019-03-30 ENCOUNTER — Telehealth: Payer: Self-pay | Admitting: Neurology

## 2019-03-30 MED ORDER — DONEPEZIL HCL 10 MG PO TABS: ORAL_TABLET | ORAL | 2 refills | Status: DC

## 2019-03-30 NOTE — Telephone Encounter (Signed)
Pt is needing a refill on her donepezil (ARICEPT) 10 MG tablet but she is needing a 90 day supply and its needing to be sent to  [place], [place] [phone] (Phone) [phone] (Fax)    Please advise.

## 2019-03-30 NOTE — Telephone Encounter (Signed)
Spoke with Dr. Jaynee Eagles and received v.o. to send in 90 day supply of Aricept. Refill sent to [facility].

## 2019-05-04 ENCOUNTER — Other Ambulatory Visit: Payer: Self-pay

## 2019-05-04 ENCOUNTER — Ambulatory Visit (INDEPENDENT_AMBULATORY_CARE_PROVIDER_SITE_OTHER): Payer: Medicare Other | Admitting: Orthopedic Surgery

## 2019-05-04 ENCOUNTER — Encounter: Payer: Self-pay | Admitting: Orthopedic Surgery

## 2019-05-04 DIAGNOSIS — M25511 Pain in right shoulder: Secondary | ICD-10-CM | POA: Diagnosis not present

## 2019-05-04 NOTE — Progress Notes (Signed)
Office Visit Note   Patient: Jenny [name]           Date of Birth: 12/03/1931           MRN: [id number] Visit Date: 05/04/2019 Requested by: Jenny HasMorrow, Aaron, MD [address] [place],  [place] [id number] PCP: Jenny HasMorrow, Aaron, MD  Subjective: Chief Complaint  Patient presents with  . Right Shoulder - Pain    HPI: Jenny ForestShirley is a patient with right shoulder pain.  Had injection last office visit.  She is doing well with that.  Decision point today was for or against another injection.              ROS: All systems reviewed are negative as they relate to the chief complaint within the history of present illness.  Patient denies  fevers or chills.   Assessment & Plan: Visit Diagnoses: No diagnosis found.  Plan: Impression is improvement in right shoulder symptoms following injection.  She has essentially full active and passive range of motion now and is doing home exercises.  I think that she should do her home exercises on a daily basis and come back if she needs to for repeat evaluation and possible injection.  Follow-Up Instructions: Return if symptoms worsen or fail to improve.   Orders:  No orders of the defined types were placed in this encounter.  No orders of the defined types were placed in this encounter.     Procedures: No procedures performed   Clinical Data: No additional findings.  Objective: Vital Signs: There were no vitals taken for this visit.  Physical Exam:   Constitutional: Patient appears well-developed HEENT:  Head: Normocephalic Eyes:EOM are normal Neck: Normal range of motion Cardiovascular: Normal rate Pulmonary/chest: Effort normal Neurologic: Patient is alert Skin: Skin is warm Psychiatric: Patient has normal mood and affect    Ortho Exam: Ortho exam demonstrates full active and passive range of motion abduction and forward flexion of the right shoulder with only mild crepitus with active and passive range of motion and.   Her rotator cuff strength is excellent for infraspinatus supraspinatus and subscap muscle testing.  Specialty Comments:  No specialty comments available.  Imaging: No results found.   PMFS History: Patient Active Problem List   Diagnosis Date Noted  . 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 Cognitive changes 07/20/2016  . Left foot pain 03/29/2011  . Plantar fasciitis 03/29/2011   Past Medical History:  Diagnosis Date  . B12 deficiency   . Back pain   . Dementia due to Alzheimer's disease (HCC)   . Dysphagia   . Fibromyalgia   . Hypertension   . Hypothyroidism   . Lung nodule   . Memory changes   . Moderate depressive episode (HCC)   . Osteopenia   . Pharyngoesophageal dysphagia   . Thyroid disease   . Urinary incontinence    stress incontinence per pt   . Vitamin D deficiency   . Weight loss     Family History  Problem Relation Age of Onset  . Pneumonia Mother   . Heart Problems Father   . Alzheimer's disease Sister   . Heart Problems Other   . Heart Problems Sister     Past Surgical History:  Procedure Laterality Date  . APPENDECTOMY    . BACK SURGERY     lumbar   . CATARACT EXTRACTION Right   . SALPINGOOPHORECTOMY    . VAGINAL HYSTERECTOMY  Social History   Occupational History  . Occupation: Retired  Tobacco Use  . Smoking status: Former Research scientist (life sciences)  . Smokeless tobacco: Never Used  Substance and Sexual Activity  . Alcohol use: No  . Drug use: No  . Sexual activity: Not on file

## 2019-06-25 ENCOUNTER — Ambulatory Visit: Payer: Medicare Other | Admitting: Neurology

## 2019-06-25 NOTE — Progress Notes (Signed)
Patient is doing extremely well, she is living with her POA, no significant changes. I am canceling appointment today since patient is doing great, don't want to expose her to covid unnecessarily, reschedule for 6 months. No charge.

## 2019-08-27 ENCOUNTER — Encounter: Payer: Self-pay | Admitting: Family Medicine

## 2019-08-27 ENCOUNTER — Other Ambulatory Visit: Payer: Self-pay

## 2019-08-27 ENCOUNTER — Ambulatory Visit: Payer: Medicare Other | Admitting: Family Medicine

## 2019-08-27 DIAGNOSIS — M25559 Pain in unspecified hip: Secondary | ICD-10-CM | POA: Diagnosis not present

## 2019-08-27 MED ORDER — NABUMETONE 500 MG PO TABS: 500.0000 mg | ORAL_TABLET | Freq: Two times a day (BID) | ORAL | 3 refills | Status: DC | PRN

## 2019-08-27 MED ORDER — DICLOFENAC SODIUM 1 % TD GEL: 4.0000 g | Freq: Four times a day (QID) | TRANSDERMAL | 6 refills | Status: AC | PRN

## 2019-08-27 NOTE — Progress Notes (Signed)
Office Visit Note   Patient: Jenny [name]           Date of Birth: Jan 27, 1932           MRN: [id number] Visit Date: 08/27/2019 Requested by: London Pepper, MD [address],  [place] [address] PCP: London Pepper, MD  Subjective: Chief Complaint  Patient presents with  . Left Hip - Pain    Pain in the lateral hip/buttock/groin area and occasionally down the anterior thigh since Monday 08/24/19. NKI - just woke up with pain. Pain is less today.    HPI: She is here with left hip pain.  She woke up with pain on Monday, no injury.  Pain on that lateral aspect with occasional radiation into the groin and down the thigh.  She has been limiting her activities and her pain is improving.  She does work with a Physiological scientist twice per week on strengthening exercises.  There has been nothing new about her routine that might have injured her hip.  She has never had much trouble with her hip.  She is not taking any medication for her pain.  Denies any low back pain.              ROS: No fevers or chills.  All other systems were reviewed and are negative.  Objective: Vital Signs: There were no vitals taken for this visit.  Physical Exam:  General:  Alert and oriented, in no acute distress. Pulm:  Breathing unlabored. Psy:  Normal mood, congruent affect. Skin: No visible rash. Left hip: She has good strength with flexion, abduction, adduction, knee extension, ankle dorsiflexion, eversion and plantarflexion.  She has tenderness over the greater trochanter.  She has mild pain with passive flexion and internal rotation but her hip range of motion is good compared to the right.  Imaging: None today.  Assessment & Plan: 1.  Improving left hip pain, suspect greater trochanter syndrome.  Cannot rule out flareup of DJD. -Short-term use of Relafen, Voltaren gel topically as needed.  Rest from personal trainer until pain improves.  If it flares up, could inject with cortisone.     Procedures: No procedures performed  No notes on file     PMFS History: Patient Active Problem List   Diagnosis Date Noted  . Mild neurocognitive disorder due to Alzheimer's disease ([place]) 09/29/2018  . Cognitive changes 07/20/2016  . Left foot pain 03/29/2011  . Plantar fasciitis 03/29/2011   Past Medical History:  Diagnosis Date  . B12 deficiency   . Back pain   . Dementia due to Alzheimer's disease ([place])   . Dysphagia   . Fibromyalgia   . Hypertension   . Hypothyroidism   . Lung nodule   . Memory changes   . Moderate depressive episode ([place])   . Osteopenia   . Pharyngoesophageal dysphagia   . Thyroid disease   . Urinary incontinence    stress incontinence per pt   . Vitamin D deficiency   . Weight loss     Family History  Problem Relation Age of Onset  . Pneumonia Mother   . Heart Problems Father   . Alzheimer's disease Sister   . Heart Problems Other   . Heart Problems Sister     Past Surgical History:  Procedure Laterality Date  . APPENDECTOMY    . BACK SURGERY     lumbar   . CATARACT EXTRACTION Right   . SALPINGOOPHORECTOMY    . VAGINAL  HYSTERECTOMY     Social History   Occupational History  . Occupation: Retired  Tobacco Use  . Smoking status: Former Games developer  . Smokeless tobacco: Never Used  Substance and Sexual Activity  . Alcohol use: No  . Drug use: No  . Sexual activity: Not on file

## 2019-11-19 ENCOUNTER — Other Ambulatory Visit: Payer: Self-pay

## 2019-11-19 ENCOUNTER — Other Ambulatory Visit: Payer: Self-pay | Admitting: Family Medicine

## 2019-11-19 ENCOUNTER — Ambulatory Visit
Admission: RE | Admit: 2019-11-19 | Discharge: 2019-11-19 | Disposition: A | Discharge status: Home / Self Care | Payer: Medicare PPO | Source: Ambulatory Visit | Attending: Family Medicine | Admitting: Family Medicine

## 2019-11-19 DIAGNOSIS — R0781 Pleurodynia: Secondary | ICD-10-CM

## 2019-11-25 ENCOUNTER — Other Ambulatory Visit: Payer: Self-pay | Admitting: Neurology

## 2019-11-26 NOTE — Telephone Encounter (Signed)
Need to clarify if pt is taking these and also schedule appt for March-April.

## 2019-12-24 ENCOUNTER — Ambulatory Visit: Payer: Medicare PPO | Attending: Internal Medicine

## 2019-12-24 DIAGNOSIS — Z23 Encounter for immunization: Secondary | ICD-10-CM | POA: Insufficient documentation

## 2019-12-24 NOTE — Progress Notes (Signed)
   Covid-19 Vaccination Clinic  Name:  Apollonia Amini    MRN: [id number] DOB: [birth year]  12/24/2019  Ms. Gombos was observed post Covid-19 immunization for 15 minutes without incident. She was provided with Vaccine Information Sheet and instruction to access the V-Safe system.   Ms. Vo was instructed to call 911 with any severe reactions post vaccine: Marland Kitchen Difficulty breathing  . Swelling of face and throat  . A fast heartbeat  . A bad rash all over body  . Dizziness and weakness   Immunizations Administered    Name Date Dose VIS Date Route   Pfizer COVID-19 Vaccine 12/24/2019  9:12 AM 0.3 mL 10/02/2019 Intramuscular   Manufacturer: [employer], [employer]   Lot: [id number]   NDC: [address]-9

## 2020-01-18 ENCOUNTER — Ambulatory Visit: Payer: Medicare PPO | Attending: Internal Medicine

## 2020-01-18 DIAGNOSIS — Z23 Encounter for immunization: Secondary | ICD-10-CM

## 2020-01-18 NOTE — Progress Notes (Signed)
   Covid-19 Vaccination Clinic  Name:  Jenny [name]    MRN: [id number] DOB: August 14, 1932  01/18/2020  [name] was observed post Covid-19 immunization for 15 minutes without incident. She was provided with Vaccine Information Sheet and instruction to access the V-Safe system.   [name] was instructed to call 911 with any severe reactions post vaccine: Marland Kitchen Difficulty breathing  . Swelling of face and throat  . A fast heartbeat  . A bad rash all over body  . Dizziness and weakness   Immunizations Administered    Name Date Dose VIS Date Route   Pfizer COVID-19 Vaccine 01/18/2020  9:01 AM 0.3 mL 10/02/2019 Intramuscular   Manufacturer: [employer], [employer]   Lot: [id number]   NDC: [address]-9

## 2020-01-27 ENCOUNTER — Encounter: Payer: Self-pay | Admitting: Family Medicine

## 2020-01-27 ENCOUNTER — Other Ambulatory Visit: Payer: Self-pay

## 2020-01-27 ENCOUNTER — Ambulatory Visit: Payer: Medicare PPO | Admitting: Family Medicine

## 2020-01-27 VITALS — BP 116/66 | HR 65 | Temp 97.1°F | Ht 65.5 in | Wt 162.6 lb

## 2020-01-27 DIAGNOSIS — G301 Alzheimer's disease with late onset: Secondary | ICD-10-CM

## 2020-01-27 DIAGNOSIS — F0280 Dementia in other diseases classified elsewhere without behavioral disturbance: Secondary | ICD-10-CM | POA: Diagnosis not present

## 2020-01-27 MED ORDER — MEMANTINE HCL 10 MG PO TABS: ORAL_TABLET | ORAL | 3 refills | Status: DC

## 2020-01-27 MED ORDER — DONEPEZIL HCL 10 MG PO TABS: ORAL_TABLET | ORAL | 3 refills | Status: DC

## 2020-01-27 NOTE — Progress Notes (Addendum)
PATIENT: Jenny [name] DOB: 1932/06/15  REASON FOR VISIT: follow up HISTORY FROM: patient  Chief Complaint  Patient presents with  . Follow-up    Rm 5, sister  . Dementia     HISTORY OF PRESENT ILLNESS: Today 01/27/20 Jenny [name] is a 86 y.o. female here today for follow up for dementia. She continues Aricept and Namenda. She is doing fairly well. She is now living with her sister in law who presents with her today and aids in history. She had an event last March where she was found on the floor without cloths on. She had urinated on herself and had taken her clothes off to clean up. Her sister in law found her and was able to get her up. She does not think she had fallen. There were no injuries at that time. She had fallen earlier last year and hurt her elbow. She is able to perform ADL's independently. Her sister in law cooks but she will clean up. She does not drive. Her sister in law provides medications and helps with finances. No recent falls. She plays card games on her Ipad. She watches the news. She is able to have appropriate conversation during the moment but continues to have difficulty retaining information. She is eating and drinking normally. Weight stable. Her niece and nephew live behind her on their property and are able to help out if needed.   HISTORY: (copied from Dr Cathren Laine note on 12/23/2018)  Interval history 12/23/2018: She is having difficulty with medications, but now her sister-in-law will be following more closely. She forgets to take her medications and this was for 3 days in a row. She is tolerating the Aricept. She has a positive approach. They will check in and make phone calls. She gets upset that this is happening. The family is helping with automatic deposits and payents, she has a POA. She is not cooking, feels she is doing well, she has a lot of support.   Interval history 09/29/2018: Here for follow up.  Patient was diagnosed with mild dementia most  likely due to Alzheimer's disease.  See Dr. Corliss Blacker assessment August 05, 2018.  Results of cognitive testing revealed prominent deficits in learning and memory consolidation as well as some aspects of executive functioning.  These deficits are interfering with her ability to manage complex ADLs including driving, medications, appointments, finances and fits the diagnostic criteria for dementia.  No evidence of a primary psychiatric disorder.  She tried Aricept and Namenda in the past and had side effects.  Discussed trying a low-dose of Aricept again.  Interval history 6/5/20019: She is here with a friend who provides much information. She is "really getting worse". She cna;t remember what she is doing, she forgets tasks, she forgets dates and appointments, writing things down, still endorses everything in previous note (see below) from 2 years ago but worsening. She lives in an apartment. She is forgetting to take her medication. She has aphasia as well per friend. She has a lot of friends. Appears to have good insight. Sh eneeds reminders from friends. Not driving anymore, because she got lost driving, if she is someplace unfamiliar she can get lost and gets panicked. Has difficulty learning new things. She has very good insight. Friend says she notices she is withdrawing, apathy, not caring to go places also socially she is withdrawing. Had side effects to Namenda.  Dizziness with Aricept.  HPI:  Jenny [name] is a 86 y.o. female here as a  referral from Dr. Kateri Plummer for memory loss. PMHx HTN, Fibromyalgia. She doesn't remember people's names. Even people who she has known for years. Has been ongoing for at least 3 years. Has a friend here who provides much information. She has been sober for 46 years. 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 She has to write down appointments. She needs help with finances and bills  sometimes, her friend helps her. She lives in the [facility]. In the last 3-4 months memory loss has sped up. Sister with Alzheimers. Mother fine. 3 weeks ago she was feeling dizzy, she thought it was vertigo, she couldn;t remember how to get some pages in order for a presentation for AA. She has some depression as well and she is fearful about Alzheimers. She has an appt at [facility] for a dementia trial next month and will be following there. No other associated symptoms or modifying factors. Wory and stress is making her symptoms worse. Nothing makes them better and she can;t identify any triggers or inciting events, denies head trauma or previous illnesses. No other focal neurologic deficits or complaints.  Reviewed notes, labs and imaging from outside physicians, which showed:   B12 238, TSH .03  Personally reviewed MRI images 06/2016 and agree with the following:  FINDINGS: No evidence for acute infarction, hemorrhage, mass lesion, hydrocephalus, or extra-axial fluid. Global atrophy. Chronic microvascular ischemic change.  Flow voids are maintained. Small foci of chronic hemorrhage in the supratentorial subcortical white matter, likely sequelae of hypertensive cerebrovascular disease.  Pituitary, pineal, and cerebellar tonsils unremarkable. No upper cervical cord lesions. Prior cervical fusion.  Visualized calvarium, skull base, and upper cervical osseous structures unremarkable. Scalp and extracranial soft tissues, orbits, sinuses, and mastoids show no acute process.  Post infusion, no abnormal enhancement of the brain or meninges. Major dural venous sinuses are patent.  Compared with 2008, there is progression of both atrophy and chronic microvascular ischemic change.  IMPRESSION: Chronic changes as described. No acute intracranial abnormality. No abnormal postcontrast enhancement.  No acute cause is seen for the reported symptoms.   REVIEW OF  SYSTEMS: Out of a complete 14 system review of symptoms, the patient complains only of the following symptoms, memory loss and all other reviewed systems are negative.   ALLERGIES: Allergies  Allergen Reactions  . Benazepril Cough    Reported by PCP  . Codeine Hives  . Namenda [Memantine]     Pt cannot remember what it was but said that she had an allergic reaction to this medication.  . Statins Other (See Comments)    Took zocor- had pain all over and developed rashes on body  . Sulfa Antibiotics Hives and Nausea Only  . Adhesive [Tape] Rash    Skin irritation- blisters    HOME MEDICATIONS:/ Outpatient Medications Prior to Visit  Medication Sig Dispense Refill  . cetirizine (ZYRTEC) 10 MG tablet Take 10 mg by mouth daily.    . Cholecalciferol (VITAMIN D3 PO) Take 2,000 Units by mouth daily.     . diclofenac sodium (VOLTAREN) 1 % GEL Apply 4 g topically 4 (four) times daily as needed. 500 g 6  . ferrous sulfate 325 (65 FE) MG tablet Take 325 mg by mouth daily with breakfast.    . levothyroxine (SYNTHROID) 175 MCG tablet Take 175 mcg by mouth daily before breakfast. Omit Sunday dose    . losartan (COZAAR)  25 MG tablet Take 25 mg by mouth daily.    . Multiple Vitamins-Minerals (MULTIVITAMIN PO) Take by mouth.    . sertraline (ZOLOFT) 100 MG tablet Take 100 mg by mouth daily.    Marland Kitchen. triamcinolone cream (KENALOG) 0.1 % Apply 1 application topically as needed. 80 g 6  . vitamin B-12 (CYANOCOBALAMIN) 1000 MCG tablet Take 1,000 mcg by mouth. Takes 3 x a week    . donepezil (ARICEPT) 10 MG tablet START WITH 1/2 TABLET DAILY FOR 2 WEEKS, THEN INCREASE TO 1 TABLET DAILY 90 tablet 1  . memantine (NAMENDA) 10 MG tablet START WITH TAKING 1 TABLET DAILY. IF NO SIDE EFFECTS INCREASE TO TWICE DAILY 180 tablet 2  . CALCIUM PO Take by mouth.    . levothyroxine (SYNTHROID, LEVOTHROID) 200 MCG tablet Take 200 mcg by mouth daily before breakfast.    . losartan (COZAAR) 100 MG tablet Take 50 mg by mouth  daily.    . nabumetone (RELAFEN) 500 MG tablet Take 1 tablet (500 mg total) by mouth 2 (two) times daily as needed. 60 tablet 3  . OVER THE COUNTER MEDICATION vitamins     No facility-administered medications prior to visit.    PAST MEDICAL HISTORY: Past Medical History:  Diagnosis Date  . B12 deficiency   . Back pain   . Dementia due to Alzheimer's disease ([facility])   . Dysphagia   . Fibromyalgia   . Hypertension   . Hypothyroidism   . Lung nodule   . Memory changes   . Moderate depressive episode ([facility])   . Osteopenia   . Pharyngoesophageal dysphagia   . Thyroid disease   . Urinary incontinence    stress incontinence per pt   . Vitamin D deficiency   . Weight loss     PAST SURGICAL HISTORY: Past Surgical History:  Procedure Laterality Date  . APPENDECTOMY    . BACK SURGERY     lumbar   . CATARACT EXTRACTION Right   . SALPINGOOPHORECTOMY    . VAGINAL HYSTERECTOMY      FAMILY HISTORY: Family History  Problem Relation Age of Onset  . Pneumonia Mother   . Heart Problems Father   . Alzheimer's disease Sister   . Heart Problems Other   . Heart Problems Sister     SOCIAL HISTORY: Social History   Socioeconomic History  . Marital status: Widowed    Spouse name: Not on file  . Number of children: 0  . Years of education: 5815  . Highest education level: Not on file  Occupational History  . Occupation: Retired  Tobacco Use  . Smoking status: Former Games developermoker  . Smokeless tobacco: Never Used  Substance and Sexual Activity  . Alcohol use: No  . Drug use: No  . Sexual activity: Not on file  Other Topics Concern  . Not on file  Social History Narrative   Lives alone   Caffeine use: sometimes   Right handed   Social Determinants of Health   Financial Resource Strain:   . Difficulty of Paying Living Expenses:   Food Insecurity:   . Worried About Programme researcher, broadcasting/film/videounning Out of Food in the Last Year:   . Baristaan Out of Food in the Last Year:   Transportation Needs:   . Automotive engineerLack of  Transportation (Medical):   Marland Kitchen. Lack of Transportation (Non-Medical):   Physical Activity:   . Days of Exercise per Week:   . Minutes of Exercise per Session:   Stress:   . Feeling  of Stress :   Social Connections:   . Frequency of Communication with Friends and Family:   . Frequency of Social Gatherings with Friends and Family:   . Attends Religious Services:   . Active Member of Clubs or Organizations:   . Attends Banker Meetings:   Marland Kitchen Marital Status:   Intimate Partner Violence:   . Fear of Current or Ex-Partner:   . Emotionally Abused:   Marland Kitchen Physically Abused:   . Sexually Abused:       PHYSICAL EXAM  Vitals:   01/27/20 1039  BP: 116/66  Pulse: 65  Temp: (!) 97.1 F (36.2 C)  SpO2: 95%  Weight: 162 lb 9.6 oz (73.8 kg)  Height: 5' 5.5" (1.664 m)   Body mass index is 26.65 kg/m.  Generalized: Well developed, in no acute distress  Cardiology: normal rate and rhythm, no murmur noted Respiratory: clear to auscultation bilaterally  Neurological examination  Mentation: Alert oriented to time, place, history taking. Follows all commands speech and language fluent Cranial nerve II-XII: Pupils were equal round reactive to light. Extraocular movements were full, visual field were full on confrontational test. Facial sensation and strength were normal. Uvula tongue midline. Head turning and shoulder shrug  were normal and symmetric. Motor: The motor testing reveals 5 over 5 strength of all 4 extremities. Good symmetric motor tone is noted throughout.  Sensory: Sensory testing is intact to soft touch on all 4 extremities. No evidence of extinction is noted.  Coordination: Cerebellar testing reveals good finger-nose-finger and heel-to-shin bilaterally.  Gait and station: Gait is normal.  Reflexes: Deep tendon reflexes are symmetric and normal bilaterally.   DIAGNOSTIC DATA (LABS, IMAGING, TESTING) - I reviewed patient records, labs, notes, testing and imaging myself  where available.  MMSE - Mini Mental State Exam 01/27/2020 03/26/2018 07/19/2016  Orientation to time 1 2 4   Orientation to Place 4 5 5   Registration 3 3 3   Attention/ Calculation 5 5 5   Recall 2 3 3   Language- name 2 objects 2 2 2   Language- repeat 1 1 1   Language- follow 3 step command 3 3 3   Language- read & follow direction 1 1 1   Write a sentence 1 1 1   Copy design 1 1 1   Total score 24 27 29      No results found for: WBC, HGB, HCT, MCV, PLT    Component Value Date/Time   NA 142 03/26/2018 1141   K 4.6 03/26/2018 1141   CL 100 03/26/2018 1141   CO2 28 03/26/2018 1141   GLUCOSE 78 03/26/2018 1141   BUN 10 03/26/2018 1141   CREATININE 0.85 03/26/2018 1141   CALCIUM 10.5 (H) 03/26/2018 1141   GFRNONAA 63 03/26/2018 1141   GFRAA 73 03/26/2018 1141   No results found for: CHOL, HDL, LDLCALC, LDLDIRECT, TRIG, CHOLHDL No results found for: Lab Results  Component Value Date   VITAMINB12 344 03/26/2018   Lab Results  Component Value Date   TSH 32.930 (H) 03/26/2018     ASSESSMENT AND PLAN 86 y.o. year old female  has a past medical history of B12 deficiency, Back pain, Dementia due to Alzheimer's disease ([facility]), Dysphagia, Fibromyalgia, Hypertension, Hypothyroidism, Lung nodule, Memory changes, Moderate depressive episode ([facility]), Osteopenia, Pharyngoesophageal dysphagia, Thyroid disease, Urinary incontinence, Vitamin D deficiency, and Weight loss. here with     ICD-10-CM   1. Late onset Alzheimer's disease without behavioral disturbance ([facility])  G30.1    F02.80     05/26/2018  is doing very well. She continues Aricept 10mg  daily and Namenda10mg  twice daily and is tolerating well. We will continue current therapy. MMSE 24/30 with initial assessment and deficit with orientation to time. After we talked for a bit, I repeated orientation questions and she was able to answer all correctly with exception of day of the week and specific date. Score would have been 27/30 which is  consistent with last MMSE in 2019. She was encouraged to stay physically and mentally active. Adequate hydration and healthy lifestyle habits encouraged. She will continue close follow up with PCP. Fall and safety precautions reviewed. She will follow up with 2020 in 1 year, sooner if needed. She and her sister in law verbalizes understanding and agreement with this plan.    No orders of the defined types were placed in this encounter.    Meds ordered this encounter  Medications  . donepezil (ARICEPT) 10 MG tablet    Sig: Take one tablet daily    Dispense:  90 tablet    Refill:  3    Order Specific Question:   Supervising Provider    Answer:   [place] Anson Fret  . memantine (NAMENDA) 10 MG tablet    Sig: TAKE ONE TABLET TWICE DAILY    Dispense:  180 tablet    Refill:  3    Order Specific Question:   Supervising Provider    Answer:   [id number] Anson Fret      I spent 25 minutes with the patient. 50% of this time was spent counseling and educating patient on plan of care and medications.    [id number], FNP-C 01/27/2020, 11:35 AM [facility] [address], [address] [place], [place] [phone]  Made any corrections needed, and agree with history, physical, neuro exam,assessment and plan as stated.     [phone], MD [facility]

## 2020-01-27 NOTE — Patient Instructions (Signed)
We will continue Aricept and Namenda.   Continue to be physically and mentally active.   Stay well hydrated. Eat a healthy diet and get regular exercise.   Follow up with [place] in 1 year, sooner if needed   Dementia Caregiver Guide Dementia is a term used to describe a number of symptoms that affect memory and thinking. The most common symptoms include:  Memory loss.  Trouble with language and communication.  Trouble concentrating.  Poor judgment.  Problems with reasoning.  Child-like behavior and language.  Extreme anxiety.  Angry outbursts.  Wandering from home or public places. Dementia usually gets worse slowly over time. In the early stages, people with dementia can stay independent and safe with some help. In later stages, they need help with daily tasks such as dressing, grooming, and using the bathroom. How to help the person with dementia cope Dementia can be frightening and confusing. Here are some tips to help the person with dementia cope with changes caused by the disease. General tips  Keep the person on track with his or her routine.  Try to identify areas where the person may need help.  Be supportive, patient, calm, and encouraging.  Gently remind the person that adjusting to changes takes time.  Help with the tasks that the person has asked for help with.  Keep the person involved in daily tasks and decisions as much as possible.  Encourage conversation, but try not to get frustrated or harried if the person struggles to find words or does not seem to appreciate your help. Communication tips  When the person is talking or seems frustrated, make eye contact and hold the person's hand.  Ask specific questions that need yes or no answers.  Use simple words, short sentences, and a calm voice. Only give one direction at a time.  When offering choices, limit them to just 1 or 2.  Avoid correcting the person in a negative way.  If the person is  struggling to find the right words, gently try to help him or her. How to recognize symptoms of stress Symptoms of stress in caregivers include:  Feeling frustrated or angry with the person with dementia.  Denying that the person has dementia or that his or her symptoms will not improve.  Feeling hopeless and unappreciated.  Difficulty sleeping.  Difficulty concentrating.  Feeling anxious, irritable, or depressed.  Developing stress-related health problems.  Feeling like you have too little time for your own life. Follow these instructions at home:   Make sure that you and the person you are caring for: ? Get regular sleep. ? Exercise regularly. ? Eat regular, nutritious meals. ? Drink enough fluid to keep your urine clear or pale yellow. ? Take over-the-counter and prescription medicines only as told by your health care providers. ? Attend all scheduled health care appointments.  Join a support group with others who are caregivers.  Ask about respite care resources so that you can have a regular break from the stress of caregiving.  Look for signs of stress in yourself and in the person you are caring for. If you notice signs of stress, take steps to manage it.  Consider any safety risks and take steps to avoid them.  Organize medications in a pill box for each day of the week.  Create a plan to handle any legal or financial matters. Get legal or financial advice if needed.  Keep a calendar in a central location to remind the person of  appointments or other activities. Tips for reducing the risk of injury  Keep floors clear of clutter. Remove rugs, magazine racks, and floor lamps.  Keep hallways well lit, especially at night.  Put a handrail and nonslip mat in the bathtub or shower.  Put childproof locks on cabinets that contain dangerous items, such as medicines, alcohol, guns, toxic cleaning items, sharp tools or utensils, matches, and lighters.  Put the locks  in places where the person cannot see or reach them easily. This will help ensure that the person does not wander out of the house and get lost.  Be prepared for emergencies. Keep a list of emergency phone numbers and addresses in a convenient area.  Remove car keys and lock garage doors so that the person does not try to get in the car and drive.  Have the person wear a bracelet that tracks locations and identifies the person as having memory problems. This should be worn at all times for safety. Where to find support: Many individuals and organizations offer support. These include:  Support groups for people with dementia and for caregivers.  Counselors or therapists.  Home health care services.  Adult day care centers. Where to find more information Alzheimer's Association: [employer] Contact a health care provider if:  The person's health is rapidly getting worse.  You are no longer able to care for the person.  Caring for the person is affecting your physical and emotional health.  The person threatens himself or herself, you, or anyone else. Summary  Dementia is a term used to describe a number of symptoms that affect memory and thinking.  Dementia usually gets worse slowly over time.  Take steps to reduce the person's risk of injury, and to plan for future care.  Caregivers need support, relief from caregiving, and time for their own lives. This information is not intended to replace advice given to you by your health care provider. Make sure you discuss any questions you have with your health care provider. Document Revised: 09/20/2017 Document Reviewed: 09/11/2016 Elsevier Patient Education  [place].   Memory Compensation Strategies  1. Use "WARM" strategy.  W= write it down  A= associate it  R= repeat it  M= make a mental note  2.   You can keep a Social worker.  Use a 3-ring notebook with sections for the following: calendar, important names  and phone numbers,  medications, doctors' names/phone numbers, lists/reminders, and a section to journal what you did  each day.   3.    Use a calendar to write appointments down.  4.    Write yourself a schedule for the day.  This can be placed on the calendar or in a separate section of the Memory Notebook.  Keeping a  regular schedule can help memory.  5.    Use medication organizer with sections for each day or morning/evening pills.  You may need help loading it  6.    Keep a basket, or pegboard by the door.  Place items that you need to take out with you in the basket or on the pegboard.  You may also want to  include a message board for reminders.  7.    Use sticky notes.  Place sticky notes with reminders in a place where the task is performed.  For example: " turn off the  stove" placed by the stove, "lock the door" placed on the door at eye level, " take your medications" on  the bathroom mirror or by the place where you normally take your medications.  8.    Use alarms/timers.  Use while cooking to remind yourself to check on food or as a reminder to take your medicine, or as a  reminder to make a call, or as a reminder to perform another task, etc.

## 2020-02-01 ENCOUNTER — Other Ambulatory Visit: Payer: Self-pay

## 2020-02-01 ENCOUNTER — Ambulatory Visit (INDEPENDENT_AMBULATORY_CARE_PROVIDER_SITE_OTHER): Payer: Medicare PPO

## 2020-02-01 ENCOUNTER — Ambulatory Visit: Payer: Medicare PPO | Admitting: Orthopedic Surgery

## 2020-02-01 DIAGNOSIS — M25521 Pain in right elbow: Secondary | ICD-10-CM | POA: Diagnosis not present

## 2020-02-01 DIAGNOSIS — M654 Radial styloid tenosynovitis [de Quervain]: Secondary | ICD-10-CM

## 2020-02-02 ENCOUNTER — Encounter: Payer: Self-pay | Admitting: Orthopedic Surgery

## 2020-02-02 NOTE — Progress Notes (Signed)
Office Visit Note   Patient: Jenny [name]           Date of Birth: 1932/07/26           MRN: [id number] Visit Date: 02/01/2020 Requested by: Farris Has, MD [address] [place],  [place] [address] PCP: Farris Has, MD  Subjective: Chief Complaint  Patient presents with  . Right Elbow - Pain    HPI: Jenny [name] is an 86 year old patient with right elbow olecranon bursitis.  This was aspirated 3 weeks ago with compression applied.  The swelling returned.  It is bothering her some but it sounds like she does spend a lot of time weightbearing on that elbow.  She also describes right wrist tenderness over the radial styloid.  This is as aggravating for her as her elbow.  Denies any fevers or chills or trauma.  No growth on cultures of the fluid from the aspiration 3 weeks ago.              ROS: All systems reviewed are negative as they relate to the chief complaint within the history of present illness.  Patient denies  fevers or chills.   Assessment & Plan: Visit Diagnoses:  1. Pain in right elbow   2. De Quervain's tenosynovitis, left     Plan: Impression is recurrent noninfectious right elbow olecranon bursitis which we discussed today various treatment options for.  The and decision was for observation.  High likelihood that another aspiration would likely result in recurrence and the patient's sister and the patient do not really think that the amount of aggravation in the elbow bursa really rise to the level of requiring surgical intervention.  However, regarding the right wrist the de Quervain's tenosynovitis is aggravating to say the least.  Ultrasound-guided injections performed there today for pain relief.  We will see how that works and she will follow-up as needed.  Follow-Up Instructions: Return if symptoms worsen or fail to improve.   Orders:  Orders Placed This Encounter  Procedures  . XR Elbow 2 Views Right   No orders of the defined types were  placed in this encounter.     Procedures: No procedures performed   Clinical Data: No additional findings.  Objective: Vital Signs: There were no vitals taken for this visit.  Physical Exam:   Constitutional: Patient appears well-developed HEENT:  Head: Normocephalic Eyes:EOM are normal Neck: Normal range of motion Cardiovascular: Normal rate Pulmonary/chest: Effort normal Neurologic: Patient is alert Skin: Skin is warm Psychiatric: Patient has normal mood and affect    Ortho Exam: Ortho exam demonstrates walnut size olecranon bursitis without erythema warmth or fluctuance on the right elbow.  Elbow range of motion is full.  Extensor mechanism and triceps and biceps intact.  Motor or sensory function of the right hand is intact.  Tenderness is present over the radial styloid with positive Finkelstein's test.  Radial pulses intact.  No masses lymphadenopathy or skin changes noted in that right wrist region.  Specialty Comments:  No specialty comments available.  Imaging: No results found.   PMFS History: Patient Active Problem List   Diagnosis Date Noted  . 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 Cognitive changes 07/20/2016  . Left foot pain 03/29/2011  . Plantar fasciitis 03/29/2011   Past Medical History:  Diagnosis Date  . B12 deficiency   . Back pain   . Dementia due to Alzheimer's disease (HCC)   . Dysphagia   .  Fibromyalgia   . Hypertension   . Hypothyroidism   . Lung nodule   . Memory changes   . Moderate depressive episode ([place])   . Osteopenia   . Pharyngoesophageal dysphagia   . Thyroid disease   . Urinary incontinence    stress incontinence per pt   . Vitamin D deficiency   . Weight loss     Family History  Problem Relation Age of Onset  . Pneumonia Mother   . Heart Problems Father   . Alzheimer's disease Sister   . Heart Problems Other   . Heart Problems Sister     Past Surgical History:    Procedure Laterality Date  . APPENDECTOMY    . BACK SURGERY     lumbar   . CATARACT EXTRACTION Right   . SALPINGOOPHORECTOMY    . VAGINAL HYSTERECTOMY     Social History   Occupational History  . Occupation: Retired  Tobacco Use  . Smoking status: Former Research scientist (life sciences)  . Smokeless tobacco: Never Used  Substance and Sexual Activity  . Alcohol use: No  . Drug use: No  . Sexual activity: Not on file

## 2020-02-10 DIAGNOSIS — H04123 Dry eye syndrome of bilateral lacrimal glands: Secondary | ICD-10-CM | POA: Diagnosis not present

## 2020-02-10 DIAGNOSIS — H16101 Unspecified superficial keratitis, right eye: Secondary | ICD-10-CM | POA: Diagnosis not present

## 2020-02-10 DIAGNOSIS — H52201 Unspecified astigmatism, right eye: Secondary | ICD-10-CM | POA: Diagnosis not present

## 2020-02-10 DIAGNOSIS — H31001 Unspecified chorioretinal scars, right eye: Secondary | ICD-10-CM | POA: Diagnosis not present

## 2020-03-02 DIAGNOSIS — E039 Hypothyroidism, unspecified: Secondary | ICD-10-CM | POA: Diagnosis not present

## 2020-03-02 DIAGNOSIS — I10 Essential (primary) hypertension: Secondary | ICD-10-CM | POA: Diagnosis not present

## 2020-03-02 DIAGNOSIS — Z0000 Encounter for general adult medical examination without abnormal findings: Secondary | ICD-10-CM | POA: Diagnosis not present

## 2020-03-02 DIAGNOSIS — M8580 Other specified disorders of bone density and structure, unspecified site: Secondary | ICD-10-CM | POA: Diagnosis not present

## 2020-03-02 DIAGNOSIS — E559 Vitamin D deficiency, unspecified: Secondary | ICD-10-CM | POA: Diagnosis not present

## 2020-03-02 DIAGNOSIS — E785 Hyperlipidemia, unspecified: Secondary | ICD-10-CM | POA: Diagnosis not present

## 2020-03-02 DIAGNOSIS — E538 Deficiency of other specified B group vitamins: Secondary | ICD-10-CM | POA: Diagnosis not present

## 2020-05-11 DIAGNOSIS — R197 Diarrhea, unspecified: Secondary | ICD-10-CM | POA: Diagnosis not present

## 2020-05-11 DIAGNOSIS — E039 Hypothyroidism, unspecified: Secondary | ICD-10-CM | POA: Diagnosis not present

## 2020-05-13 DIAGNOSIS — R197 Diarrhea, unspecified: Secondary | ICD-10-CM | POA: Diagnosis not present

## 2020-06-08 ENCOUNTER — Telehealth: Payer: Self-pay | Admitting: Neurology

## 2020-06-08 DIAGNOSIS — Z1231 Encounter for screening mammogram for malignant neoplasm of breast: Secondary | ICD-10-CM | POA: Diagnosis not present

## 2020-06-09 MED ORDER — DONEPEZIL HCL 10 MG PO TABS: ORAL_TABLET | ORAL | 2 refills | Status: AC

## 2020-06-09 NOTE — Addendum Note (Signed)
Addended by: Bertram Savin on: 06/09/2020 10:20 AM   Modules accepted: Orders

## 2020-06-09 NOTE — Telephone Encounter (Signed)
Rx sent to this mail order pharmacy that is on pt's preferred pharmacy list. Pending yearly appt 01/2021.

## 2020-06-09 NOTE — Telephone Encounter (Signed)
Robin @[facility] called to report they do not have have a script until April'22 for  donepezil (ARICEPT) 10 MG tablet.  Please call them @ [phone]

## 2020-06-17 DIAGNOSIS — H1811 Bullous keratopathy, right eye: Secondary | ICD-10-CM | POA: Diagnosis not present

## 2020-06-17 DIAGNOSIS — H1820 Unspecified corneal edema: Secondary | ICD-10-CM | POA: Diagnosis not present

## 2020-06-24 DIAGNOSIS — H1820 Unspecified corneal edema: Secondary | ICD-10-CM | POA: Diagnosis not present

## 2020-06-24 DIAGNOSIS — H16101 Unspecified superficial keratitis, right eye: Secondary | ICD-10-CM | POA: Diagnosis not present

## 2020-06-24 DIAGNOSIS — H01112 Allergic dermatitis of right lower eyelid: Secondary | ICD-10-CM | POA: Diagnosis not present

## 2020-07-06 DIAGNOSIS — E039 Hypothyroidism, unspecified: Secondary | ICD-10-CM | POA: Diagnosis not present

## 2020-07-06 DIAGNOSIS — M8580 Other specified disorders of bone density and structure, unspecified site: Secondary | ICD-10-CM | POA: Diagnosis not present

## 2020-07-06 DIAGNOSIS — Z6825 Body mass index (BMI) 25.0-25.9, adult: Secondary | ICD-10-CM | POA: Diagnosis not present

## 2020-07-06 DIAGNOSIS — Z9889 Other specified postprocedural states: Secondary | ICD-10-CM | POA: Diagnosis not present

## 2020-07-06 DIAGNOSIS — M810 Age-related osteoporosis without current pathological fracture: Secondary | ICD-10-CM | POA: Diagnosis not present

## 2020-07-06 DIAGNOSIS — E559 Vitamin D deficiency, unspecified: Secondary | ICD-10-CM | POA: Diagnosis not present

## 2020-07-12 DIAGNOSIS — Z23 Encounter for immunization: Secondary | ICD-10-CM | POA: Diagnosis not present

## 2020-07-15 DIAGNOSIS — H16223 Keratoconjunctivitis sicca, not specified as Sjogren's, bilateral: Secondary | ICD-10-CM | POA: Diagnosis not present

## 2020-07-25 ENCOUNTER — Ambulatory Visit (INDEPENDENT_AMBULATORY_CARE_PROVIDER_SITE_OTHER): Payer: Medicare PPO

## 2020-07-25 ENCOUNTER — Encounter: Payer: Self-pay | Admitting: Orthopedic Surgery

## 2020-07-25 ENCOUNTER — Ambulatory Visit (INDEPENDENT_AMBULATORY_CARE_PROVIDER_SITE_OTHER): Payer: Medicare PPO | Admitting: Orthopedic Surgery

## 2020-07-25 DIAGNOSIS — M7062 Trochanteric bursitis, left hip: Secondary | ICD-10-CM

## 2020-07-25 DIAGNOSIS — M25552 Pain in left hip: Secondary | ICD-10-CM

## 2020-07-25 MED ORDER — MELOXICAM 7.5 MG PO TABS: ORAL_TABLET | ORAL | 0 refills | Status: DC

## 2020-07-25 NOTE — Progress Notes (Signed)
Office Visit Note   Patient: Jenny [name]           Date of Birth: 1932/05/21           MRN: [id number] Visit Date: 07/25/2020 Requested by: Farris Has, MD [address] [place],  [place] [address] PCP: Farris Has, MD  Subjective: Chief Complaint  Patient presents with  . Left Hip - Pain    HPI: Jenny [name] is an 86 year old patient with left hip pain localized to the trochanteric region.  Is been going on for a week.  No history of trauma.  Radiates down above the knee but not below the knee.  No back pain no numbness and tingling no groin pain.  She is able to sleep on that side.  Does not take any medication for the problem.  Did have back surgery 40 years ago.              ROS: All systems reviewed are negative as they relate to the chief complaint within the history of present illness.  Patient denies  fevers or chills.   Assessment & Plan: Visit Diagnoses:  1. Pain in left hip   2. Trochanteric bursitis, left hip     Plan: Impression is left hip pain which looks like trochanteric bursitis.  We are going to try stretching and Mobic 7.5 mg a day for 10 days #15 tabs.  If that does not work she should come back in about 10 days for ultrasound-guided trochanteric injection.  She will let us know if she wants to do that.  Radiographs look good today in terms of absence of hip arthritis  Follow-Up Instructions: Return if symptoms worsen or fail to improve.   Orders:  Orders Placed This Encounter  Procedures  . XR HIP UNILAT W OR W/O PELVIS 2-3 VIEWS LEFT   No orders of the defined types were placed in this encounter.     Procedures: No procedures performed   Clinical Data: No additional findings.  Objective: Vital Signs: There were no vitals taken for this visit.  Physical Exam:   Constitutional: Patient appears well-developed HEENT:  Head: Normocephalic Eyes:EOM are normal Neck: Normal range of motion Cardiovascular: Normal  rate Pulmonary/chest: Effort normal Neurologic: Patient is alert Skin: Skin is warm Psychiatric: Patient has normal mood and affect    Ortho Exam: Ortho exam demonstrates normal gait alignment.  No nerve root tension signs.  Has very good ankle dorsiflexion strength as well as hip flexion strength as well as no groin pain with internal extra rotation of the leg.  No other masses lymphadenopathy or skin changes noted in that left hip region.  Does have trochanteric tenderness to direct palpation on the left but not the right.  Specialty Comments:  No specialty comments available.  Imaging: XR HIP UNILAT W OR W/O PELVIS 2-3 VIEWS LEFT  Result Date: 07/25/2020 AP pelvis lateral left hip reviewed.  Joint space maintained.  No acute fracture.  Bone slightly osteopenic.  Screw fixation noted in the lower lumbar spine.    PMFS History: Patient Active Problem List   Diagnosis Date Noted  . 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 Cognitive changes 07/20/2016  . Left foot pain 03/29/2011  . Plantar fasciitis 03/29/2011   Past Medical History:  Diagnosis Date  . B12 deficiency   . Back pain   . Dementia due to Alzheimer's disease (HCC)   . Dysphagia   . Fibromyalgia   .  Hypertension   . Hypothyroidism   . Lung nodule   . Memory changes   . Moderate depressive episode (HCC)   . Osteopenia   . Pharyngoesophageal dysphagia   . Thyroid disease   . Urinary incontinence    stress incontinence per pt   . Vitamin D deficiency   . Weight loss     Family History  Problem Relation Age of Onset  . Pneumonia Mother   . Heart Problems Father   . Alzheimer's disease Sister   . Heart Problems Other   . Heart Problems Sister     Past Surgical History:  Procedure Laterality Date  . APPENDECTOMY    . BACK SURGERY     lumbar   . CATARACT EXTRACTION Right   . SALPINGOOPHORECTOMY    . VAGINAL HYSTERECTOMY     Social History   Occupational History   . Occupation: Retired  Tobacco Use  . Smoking status: Former Games developer  . Smokeless tobacco: Never Used  Vaping Use  . Vaping Use: Never used  Substance and Sexual Activity  . Alcohol use: No  . Drug use: No  . Sexual activity: Not on file

## 2020-08-03 ENCOUNTER — Telehealth: Payer: Self-pay

## 2020-08-03 NOTE — Telephone Encounter (Signed)
Patient called and stated meloxicam isnt working she is wondering what to do next. Call back:[phone]

## 2020-08-03 NOTE — Telephone Encounter (Signed)
Please advise. Thanks.  

## 2020-08-04 ENCOUNTER — Other Ambulatory Visit: Payer: Self-pay | Admitting: Surgical

## 2020-08-04 ENCOUNTER — Telehealth: Payer: Self-pay | Admitting: Orthopedic Surgery

## 2020-08-04 MED ORDER — MELOXICAM 7.5 MG PO TABS: 7.5000 mg | ORAL_TABLET | Freq: Every day | ORAL | 0 refills | Status: DC | PRN

## 2020-08-04 NOTE — Telephone Encounter (Signed)
Tried calling. No answer. LMVM for her to call office back to schedule appt with Dr August Saucer or Franky Macho.

## 2020-08-04 NOTE — Telephone Encounter (Signed)
Come back in for injection

## 2020-08-04 NOTE — Telephone Encounter (Signed)
Pt called back to schedule her injection appt and asked that we call in refill of her meloxicam please  [phone]

## 2020-08-04 NOTE — Telephone Encounter (Signed)
Pls advise. Thanks.  

## 2020-08-04 NOTE — Telephone Encounter (Signed)
Okay for appt.  Submitted refill

## 2020-08-05 NOTE — Telephone Encounter (Signed)
Can you get patient scheduled with Dr Dean/Luke

## 2020-08-06 ENCOUNTER — Other Ambulatory Visit: Payer: Self-pay | Admitting: Orthopedic Surgery

## 2020-08-08 NOTE — Telephone Encounter (Signed)
Pls advise. Thanks.  

## 2020-08-10 ENCOUNTER — Encounter: Payer: Self-pay | Admitting: Orthopedic Surgery

## 2020-08-10 ENCOUNTER — Other Ambulatory Visit: Payer: Self-pay

## 2020-08-10 ENCOUNTER — Ambulatory Visit (INDEPENDENT_AMBULATORY_CARE_PROVIDER_SITE_OTHER): Payer: Medicare PPO | Admitting: Orthopedic Surgery

## 2020-08-10 DIAGNOSIS — M79605 Pain in left leg: Secondary | ICD-10-CM | POA: Diagnosis not present

## 2020-08-10 MED ORDER — MELOXICAM 7.5 MG PO TABS: ORAL_TABLET | ORAL | 0 refills | Status: AC

## 2020-08-10 NOTE — Progress Notes (Signed)
Office Visit Note   Patient: Jenny [name]           Date of Birth: 01-Nov-1931           MRN: [id number] Visit Date: 08/10/2020 Requested by: Farris Has, MD [address] [place],  [place] [address] PCP: Farris Has, MD  Subjective: Chief Complaint  Patient presents with  . Left Hip - Pain    HPI: Jenny [name] is an 86 year old patient with left hip pain.  Localizes the pain in the buttock region and not really discretely around the trochanteric region.  She has a history of 2 back surgeries many years ago.  The pain does radiate down to the knee.  Denies any numbness and tingling.  Does hurt for her to get up.  Hurts for her to move sideways.  Difficult for her to weight-bear.  Has constant pain with ambulation and activity.  Not taking any medications currently for the problem.              ROS: All systems reviewed are negative as they relate to the chief complaint within the history of present illness.  Patient denies  fevers or chills.   Assessment & Plan: Visit Diagnoses:  1. Pain in left leg     Plan: Impression is left hip pain with radiation down the leg but not below the knee.  Trochanter is not really particularly tender.  Hip radiographs normal with no arthritis.  No groin pain at this time.  In all likelihood this represents lumbar spine problem.  Needs lumbar spine MRI to evaluate left-sided radiculopathy with likely ESI's to follow.  Mobic prescribed as well 7.5 mg a day for 30 days.  Follow-Up Instructions: Return for after MRI.   Orders:  Orders Placed This Encounter  Procedures  . MR Lumbar Spine w/o contrast   Meds ordered this encounter  Medications  . meloxicam (MOBIC) 7.5 MG tablet    Sig: 1 po q d prn pain    Dispense:  30 tablet    Refill:  0      Procedures: No procedures performed   Clinical Data: No additional findings.  Objective: Vital Signs: There were no vitals taken for this visit.  Physical Exam:    Constitutional: Patient appears well-developed HEENT:  Head: Normocephalic Eyes:EOM are normal Neck: Normal range of motion Cardiovascular: Normal rate Pulmonary/chest: Effort normal Neurologic: Patient is alert Skin: Skin is warm Psychiatric: Patient has normal mood and affect    Ortho Exam: Ortho exam demonstrates good ankle dorsiflexion plantarflexion quad hamstring strength as well as good hip flexion strength bilaterally.  No discrete tenderness over the trochanteric region.  No groin pain with internal extra rotation of either leg.  No real pain with hip abduction.  Specialty Comments:  No specialty comments available.  Imaging: No results found.   PMFS History: Patient Active Problem List   Diagnosis Date Noted  . 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 Cognitive changes 07/20/2016  . Left foot pain 03/29/2011  . Plantar fasciitis 03/29/2011   Past Medical History:  Diagnosis Date  . B12 deficiency   . Back pain   . Dementia due to Alzheimer's disease (HCC)   . Dysphagia   . Fibromyalgia   . Hypertension   . Hypothyroidism   . Lung nodule   . Memory changes   . Moderate depressive episode (HCC)   . Osteopenia   . Pharyngoesophageal dysphagia   . Thyroid  disease   . Urinary incontinence    stress incontinence per pt   . Vitamin D deficiency   . Weight loss     Family History  Problem Relation Age of Onset  . Pneumonia Mother   . Heart Problems Father   . Alzheimer's disease Sister   . Heart Problems Other   . Heart Problems Sister     Past Surgical History:  Procedure Laterality Date  . APPENDECTOMY    . BACK SURGERY     lumbar   . CATARACT EXTRACTION Right   . SALPINGOOPHORECTOMY    . VAGINAL HYSTERECTOMY     Social History   Occupational History  . Occupation: Retired  Tobacco Use  . Smoking status: Former Games developer  . Smokeless tobacco: Never Used  Vaping Use  . Vaping Use: Never used  Substance  and Sexual Activity  . Alcohol use: No  . Drug use: No  . Sexual activity: Not on file

## 2020-08-15 ENCOUNTER — Encounter: Payer: Self-pay | Admitting: Family Medicine

## 2020-09-02 ENCOUNTER — Ambulatory Visit
Admission: RE | Admit: 2020-09-02 | Discharge: 2020-09-02 | Disposition: A | Discharge status: Home / Self Care | Payer: Medicare PPO | Source: Ambulatory Visit | Attending: Orthopedic Surgery | Admitting: Orthopedic Surgery

## 2020-09-02 ENCOUNTER — Other Ambulatory Visit: Payer: Self-pay

## 2020-09-02 DIAGNOSIS — M48061 Spinal stenosis, lumbar region without neurogenic claudication: Secondary | ICD-10-CM | POA: Diagnosis not present

## 2020-09-02 DIAGNOSIS — M79605 Pain in left leg: Secondary | ICD-10-CM

## 2020-09-02 DIAGNOSIS — M545 Low back pain, unspecified: Secondary | ICD-10-CM | POA: Diagnosis not present

## 2020-09-06 ENCOUNTER — Other Ambulatory Visit: Payer: Self-pay | Admitting: Neurology

## 2020-09-07 ENCOUNTER — Ambulatory Visit: Payer: Medicare PPO | Admitting: Orthopedic Surgery

## 2020-09-07 DIAGNOSIS — M79605 Pain in left leg: Secondary | ICD-10-CM

## 2020-09-10 ENCOUNTER — Encounter: Payer: Self-pay | Admitting: Orthopedic Surgery

## 2020-09-10 NOTE — Progress Notes (Signed)
Office Visit Note   Patient: Jenny [name]           Date of Birth: 03-Mar-1932           MRN: [id number] Visit Date: 09/07/2020 Requested by: Farris Has, MD [address] [place],  [place] [address] PCP: Farris Has, MD  Subjective: Chief Complaint  Patient presents with  . scan review    HPI: Makalia is an 86 year old patient with low back pain.  Since have seen her she had an MRI scan which is reviewed.  Looks like she has some stenosis above the level of her prior fusion.  Does not really what the scan report describe but visually it does appear that she has at least moderate stenosis above her prior fusion.  Takes 1 Mobic a day.  Reports pain at rest along with pain with walking.  She is using a walker.              ROS: All systems reviewed are negative as they relate to the chief complaint within the history of present illness.  Patient denies  fevers or chills.   Assessment & Plan: Visit Diagnoses:  1. Pain in left leg     Plan: Impression is spinal stenosis and radicular pain from adjacent segment disease in the lumbar spine.  Plan is referral to Dr. Alvester Morin for lumbar spine ESI.  Follow-up with me as needed.  I do not think she is interested in pursuing any type of surgical intervention.  Strength is pretty reasonable at this time with no red flag symptoms  Follow-Up Instructions: No follow-ups on file.   Orders:  Orders Placed This Encounter  Procedures  . Ambulatory referral to Physical Medicine Rehab   No orders of the defined types were placed in this encounter.     Procedures: No procedures performed   Clinical Data: No additional findings.  Objective: Vital Signs: There were no vitals taken for this visit.  Physical Exam:   Constitutional: Patient appears well-developed HEENT:  Head: Normocephalic Eyes:EOM are normal Neck: Normal range of motion Cardiovascular: Normal rate Pulmonary/chest: Effort normal Neurologic: Patient  is alert Skin: Skin is warm Psychiatric: Patient has normal mood and affect    Ortho Exam: Ortho exam demonstrates patient walks with a walker.  Has good ankle dorsiflexion plantarflexion quad hamstring adduction abduction and hip flexion strength bilaterally.  No definite paresthesias L1 S1 bilaterally.  Pedal pulses palpable.  Mild pain with forward lateral bending.  Specialty Comments:  No specialty comments available.  Imaging: No results found.   PMFS History: Patient Active Problem List   Diagnosis Date Noted  . 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 Cognitive changes 07/20/2016  . Left foot pain 03/29/2011  . Plantar fasciitis 03/29/2011   Past Medical History:  Diagnosis Date  . B12 deficiency   . Back pain   . Dementia due to Alzheimer's disease (HCC)   . Dysphagia   . Fibromyalgia   . Hypertension   . Hypothyroidism   . Lung nodule   . Memory changes   . Moderate depressive episode (HCC)   . Osteopenia   . Pharyngoesophageal dysphagia   . Thyroid disease   . Urinary incontinence    stress incontinence per pt   . Vitamin D deficiency   . Weight loss     Family History  Problem Relation Age of Onset  . Pneumonia Mother   . Heart Problems Father   .  Alzheimer's disease Sister   . Heart Problems Other   . Heart Problems Sister     Past Surgical History:  Procedure Laterality Date  . APPENDECTOMY    . BACK SURGERY     lumbar   . CATARACT EXTRACTION Right   . SALPINGOOPHORECTOMY    . VAGINAL HYSTERECTOMY     Social History   Occupational History  . Occupation: Retired  Tobacco Use  . Smoking status: Former Games developer  . Smokeless tobacco: Never Used  Vaping Use  . Vaping Use: Never used  Substance and Sexual Activity  . Alcohol use: No  . Drug use: No  . Sexual activity: Not on file

## 2020-09-12 DIAGNOSIS — Z888 Allergy status to other drugs, medicaments and biological substances status: Secondary | ICD-10-CM | POA: Diagnosis not present

## 2020-09-12 DIAGNOSIS — M1990 Unspecified osteoarthritis, unspecified site: Secondary | ICD-10-CM | POA: Diagnosis not present

## 2020-09-12 DIAGNOSIS — G8929 Other chronic pain: Secondary | ICD-10-CM | POA: Diagnosis not present

## 2020-09-12 DIAGNOSIS — E538 Deficiency of other specified B group vitamins: Secondary | ICD-10-CM | POA: Diagnosis not present

## 2020-09-12 DIAGNOSIS — H04129 Dry eye syndrome of unspecified lacrimal gland: Secondary | ICD-10-CM | POA: Diagnosis not present

## 2020-09-12 DIAGNOSIS — Z7409 Other reduced mobility: Secondary | ICD-10-CM | POA: Diagnosis not present

## 2020-09-12 DIAGNOSIS — Z791 Long term (current) use of non-steroidal anti-inflammatories (NSAID): Secondary | ICD-10-CM | POA: Diagnosis not present

## 2020-09-12 DIAGNOSIS — E039 Hypothyroidism, unspecified: Secondary | ICD-10-CM | POA: Diagnosis not present

## 2020-09-12 DIAGNOSIS — F325 Major depressive disorder, single episode, in full remission: Secondary | ICD-10-CM | POA: Diagnosis not present

## 2020-09-12 DIAGNOSIS — Z885 Allergy status to narcotic agent status: Secondary | ICD-10-CM | POA: Diagnosis not present

## 2020-09-12 DIAGNOSIS — I10 Essential (primary) hypertension: Secondary | ICD-10-CM | POA: Diagnosis not present

## 2020-09-28 DIAGNOSIS — E538 Deficiency of other specified B group vitamins: Secondary | ICD-10-CM | POA: Diagnosis not present

## 2020-09-28 DIAGNOSIS — E785 Hyperlipidemia, unspecified: Secondary | ICD-10-CM | POA: Diagnosis not present

## 2020-09-28 DIAGNOSIS — E559 Vitamin D deficiency, unspecified: Secondary | ICD-10-CM | POA: Diagnosis not present

## 2020-09-28 DIAGNOSIS — M549 Dorsalgia, unspecified: Secondary | ICD-10-CM | POA: Diagnosis not present

## 2020-09-28 DIAGNOSIS — I10 Essential (primary) hypertension: Secondary | ICD-10-CM | POA: Diagnosis not present

## 2020-09-28 DIAGNOSIS — E039 Hypothyroidism, unspecified: Secondary | ICD-10-CM | POA: Diagnosis not present

## 2020-09-29 DIAGNOSIS — E162 Hypoglycemia, unspecified: Secondary | ICD-10-CM | POA: Diagnosis not present

## 2020-10-04 ENCOUNTER — Encounter: Payer: Self-pay | Admitting: Physical Medicine and Rehabilitation

## 2020-10-04 ENCOUNTER — Ambulatory Visit: Payer: Self-pay

## 2020-10-04 ENCOUNTER — Other Ambulatory Visit: Payer: Self-pay

## 2020-10-04 ENCOUNTER — Ambulatory Visit (INDEPENDENT_AMBULATORY_CARE_PROVIDER_SITE_OTHER): Payer: Medicare PPO | Admitting: Physical Medicine and Rehabilitation

## 2020-10-04 VITALS — BP 124/67 | HR 63

## 2020-10-04 DIAGNOSIS — M5416 Radiculopathy, lumbar region: Secondary | ICD-10-CM | POA: Diagnosis not present

## 2020-10-04 DIAGNOSIS — M961 Postlaminectomy syndrome, not elsewhere classified: Secondary | ICD-10-CM

## 2020-10-04 MED ORDER — METHYLPREDNISOLONE ACETATE 80 MG/ML IJ SUSP
80.0000 mg | Freq: Once | INTRAMUSCULAR | Status: AC
  Administered 2020-10-04: 14:00:00 80 mg

## 2020-10-04 NOTE — Progress Notes (Signed)
Pt state lower back pain that travels to her left side. Pt state walking and standing makes the pain worse. Pt state she sit to rest and sometimes use pain meds to help ease the pain.  Numeric Pain Rating Scale and Functional Assessment Average Pain 8   In the last MONTH (on 0-10 scale) has pain interfered with the following?  1. General activity like being  able to carry out your everyday physical activities such as walking, climbing stairs, carrying groceries, or moving a chair?  Rating(8)   +Driver, -BT, -Dye Allergies.

## 2020-12-04 NOTE — Procedures (Signed)
Lumbosacral Transforaminal Epidural Steroid Injection - Sub-Pedicular Approach with Fluoroscopic Guidance  Patient: Jenny [name]      Date of Birth: 1931-12-13 MRN: [id number] PCP: Farris Has, MD      Visit Date: 10/04/2020   Universal Protocol:    Date/Time: 10/04/2020  Consent Given By: the patient  Position: PRONE  Additional Comments: Vital signs were monitored before and after the procedure. Patient was prepped and draped in the usual sterile fashion. The correct patient, procedure, and site was verified.   Injection Procedure Details:   Procedure diagnoses: Lumbar radiculopathy [M54.16]    Meds Administered:  Meds ordered this encounter  Medications  . methylPREDNISolone acetate (DEPO-MEDROL) injection 80 mg    Laterality: Left  Location/Site:  L5-S1 S1-2  Needle:5.0 in., 22 ga.  Short bevel or Quincke spinal needle  Needle Placement: Transforaminal  Findings:    -Comments: Excellent flow of contrast along the nerve, nerve root and into the epidural space.  Really unable to enter the left L5 foramen very well do to lateral mass fusion.  Flow contrast at S1 was very good.  Procedure Details: After squaring off the end-plates to get a true AP view, the C-arm was positioned so that an oblique view of the foramen as noted above was visualized. The target area is just inferior to the "nose of the scotty dog" or sub pedicular. The soft tissues overlying this structure were infiltrated with 2-3 ml. of 1% Lidocaine without Epinephrine.  The spinal needle was inserted toward the target using a "trajectory" view along the fluoroscope beam.  Under AP and lateral visualization, the needle was advanced so it did not puncture dura and was located close the 6 O'Clock position of the pedical in AP tracterory. Biplanar projections were used to confirm position. Aspiration was confirmed to be negative for CSF and/or blood. A 1-2 ml. volume of Isovue-250 was injected and flow of  contrast was noted at each level. Radiographs were obtained for documentation purposes.   After attaining the desired flow of contrast documented above, a 0.5 to 1.0 ml test dose of 0.25% Marcaine was injected into each respective transforaminal space.  The patient was observed for 90 seconds post injection.  After no sensory deficits were reported, and normal lower extremity motor function was noted,   the above injectate was administered so that equal amounts of the injectate were placed at each foramen (level) into the transforaminal epidural space.   Additional Comments:  The patient tolerated the procedure well Dressing: 2 x 2 sterile gauze and Band-Aid    Post-procedure details: Patient was observed during the procedure. Post-procedure instructions were reviewed.  Patient left the clinic in stable condition.

## 2020-12-04 NOTE — Progress Notes (Signed)
Jenny [name] - 87 y.o. female MRN [id number]  Date of birth: 06-19-1932  Office Visit Note: Visit Date: 10/04/2020 PCP: Farris Has, MD Referred by: Farris Has, MD  Subjective: Chief Complaint  Patient presents with  . Lower Back - Pain   HPI:  Jenny [name] is a 87 y.o. female who comes in today at the request of Dr. Burnard Bunting for planned Left L5-S1 and S1-2 Lumbar epidural steroid injection with fluoroscopic guidance.  The patient has failed conservative care including home exercise, medications, time and activity modification.  This injection will be diagnostic and hopefully therapeutic.  Please see requesting physician notes for further details and justification.  MRI reviewed with images and spine model.  MRI reviewed in the note below.   ROS Otherwise per HPI.  Assessment & Plan: Visit Diagnoses:    ICD-10-CM   1. Lumbar radiculopathy  M54.16 XR C-ARM NO REPORT    Epidural Steroid injection    methylPREDNISolone acetate (DEPO-MEDROL) injection 80 mg  2. Post laminectomy syndrome  M96.1     Plan: No additional findings.   Meds & Orders:  Meds ordered this encounter  Medications  . methylPREDNISolone acetate (DEPO-MEDROL) injection 80 mg    Orders Placed This Encounter  Procedures  . XR C-ARM NO REPORT  . Epidural Steroid injection    Follow-up: Return for visit to requesting physician as needed.   Procedures: No procedures performed  Lumbosacral Transforaminal Epidural Steroid Injection - Sub-Pedicular Approach with Fluoroscopic Guidance  Patient: Jenny [name]      Date of Birth: 10-19-1932 MRN: [id number] PCP: Farris Has, MD      Visit Date: 10/04/2020   Universal Protocol:    Date/Time: 10/04/2020  Consent Given By: the patient  Position: PRONE  Additional Comments: Vital signs were monitored before and after the procedure. Patient was prepped and draped in the usual sterile fashion. The correct patient, procedure, and site was  verified.   Injection Procedure Details:   Procedure diagnoses: Lumbar radiculopathy [M54.16]    Meds Administered:  Meds ordered this encounter  Medications  . methylPREDNISolone acetate (DEPO-MEDROL) injection 80 mg    Laterality: Left  Location/Site:  L5-S1 S1-2  Needle:5.0 in., 22 ga.  Short bevel or Quincke spinal needle  Needle Placement: Transforaminal  Findings:    -Comments: Excellent flow of contrast along the nerve, nerve root and into the epidural space.  Really unable to enter the left L5 foramen very well do to lateral mass fusion.  Flow contrast at S1 was very good.  Procedure Details: After squaring off the end-plates to get a true AP view, the C-arm was positioned so that an oblique view of the foramen as noted above was visualized. The target area is just inferior to the "nose of the scotty dog" or sub pedicular. The soft tissues overlying this structure were infiltrated with 2-3 ml. of 1% Lidocaine without Epinephrine.  The spinal needle was inserted toward the target using a "trajectory" view along the fluoroscope beam.  Under AP and lateral visualization, the needle was advanced so it did not puncture dura and was located close the 6 O'Clock position of the pedical in AP tracterory. Biplanar projections were used to confirm position. Aspiration was confirmed to be negative for CSF and/or blood. A 1-2 ml. volume of Isovue-250 was injected and flow of contrast was noted at each level. Radiographs were obtained for documentation purposes.   After attaining the desired flow of contrast documented above, a 0.5 to 1.0  ml test dose of 0.25% Marcaine was injected into each respective transforaminal space.  The patient was observed for 90 seconds post injection.  After no sensory deficits were reported, and normal lower extremity motor function was noted,   the above injectate was administered so that equal amounts of the injectate were placed at each foramen (level) into  the transforaminal epidural space.   Additional Comments:  The patient tolerated the procedure well Dressing: 2 x 2 sterile gauze and Band-Aid    Post-procedure details: Patient was observed during the procedure. Post-procedure instructions were reviewed.  Patient left the clinic in stable condition.      Clinical History: MRI LUMBAR SPINE WITHOUT CONTRAST  TECHNIQUE: Multiplanar, multisequence MR imaging of the lumbar spine was performed. No intravenous contrast was administered.  COMPARISON:  None.  FINDINGS: Segmentation:  Standard  Alignment:  Grade 1 anterolisthesis at L3-4  Vertebrae: There are screws in the posterior elements at the levels of the L4-5 and L5-S1 discs.  Conus medullaris and cauda equina: Conus extends to the L1 level. Conus and cauda equina appear normal.  Paraspinal and other soft tissues: Negative  Disc levels:  T11-12: Normal.  T12-L1: Normal.  L1-L2: Disc desiccation without herniation. No spinal canal stenosis. No neural foraminal stenosis.  L2-L3: Disc desiccation with mild bulge and mild facet hypertrophy. No spinal canal stenosis. No neural foraminal stenosis.  L3-L4: Disc desiccation with moderate facet hypertrophy and small disc bulge. No spinal canal stenosis. No neural foraminal stenosis.  L4-L5: Postsurgical changes. No disc herniation. Mild disc height loss. No spinal canal stenosis. No neural foraminal stenosis.  L5-S1: A severe disc space narrowing without herniation. No spinal canal stenosis. No neural foraminal stenosis.  Visualized sacrum: Normal.  IMPRESSION: 1. Posterior instrumentation at the levels of the L4-5 and L5-S1 discs. 2. Moderate L3-4 facet arthrosis with grade 1 anterolisthesis. 3. No spinal canal or neural foraminal stenosis.   Electronically Signed   By: Deatra Robinson M.D.   On: 09/03/2020 04:24     Objective:  VS:  HT:    WT:   BMI:     BP:124/67  HR:63bpm   TEMP: ( )  RESP:  Physical Exam Vitals and nursing note reviewed.  Constitutional:      General: She is not in acute distress.    Appearance: Normal appearance. She is not ill-appearing.  HENT:     Head: Normocephalic and atraumatic.     Right Ear: External ear normal.     Left Ear: External ear normal.  Eyes:     Extraocular Movements: Extraocular movements intact.  Cardiovascular:     Rate and Rhythm: Normal rate.     Pulses: Normal pulses.  Pulmonary:     Effort: Pulmonary effort is normal. No respiratory distress.  Abdominal:     General: There is no distension.     Palpations: Abdomen is soft.  Musculoskeletal:        General: Tenderness present.     Cervical back: Neck supple.     Right lower leg: No edema.     Left lower leg: No edema.     Comments: Patient has good distal strength with no pain over the greater trochanters.  No clonus or focal weakness.  Skin:    Findings: No erythema, lesion or rash.  Neurological:     General: No focal deficit present.     Mental Status: She is alert and oriented to person, place, and time.     Sensory:  No sensory deficit.     Motor: No weakness or abnormal muscle tone.     Coordination: Coordination normal.  Psychiatric:        Mood and Affect: Mood normal.        Behavior: Behavior normal.      Imaging: No results found.

## 2021-01-30 ENCOUNTER — Ambulatory Visit: Payer: Medicare PPO | Admitting: Family Medicine

## 2021-02-09 ENCOUNTER — Telehealth: Payer: Medicare PPO | Admitting: Family Medicine

## 2021-02-09 ENCOUNTER — Telehealth: Payer: Self-pay | Admitting: Family Medicine

## 2021-02-09 NOTE — Patient Instructions (Incomplete)
Below is our plan:  We will continue donepezil 10mg  daily   Please make sure you are staying well hydrated. I recommend 50-60 ounces daily. Well balanced diet and regular exercise encouraged. Consistent sleep schedule with 6-8 hours recommended.   Please continue follow up with care team as directed.   Follow up   You may receive a survey regarding today's visit. I encourage you to leave honest feed back as I do use this information to improve patient care. Thank you for seeing me today!      Management of Memory Problems   There are some general things you can do to help manage your memory problems.  Your memory may not in fact recover, but by using techniques and strategies you will be able to manage your memory difficulties better.   1)  Establish a routine. ? Try to establish and then stick to a regular routine.  By doing this, you will get used to what to expect and you will reduce the need to rely on your memory.  Also, try to do things at the same time of day, such as taking your medication or checking your calendar first thing in the morning. ? Think about think that you can do as a part of a regular routine and make a list.  Then enter them into a daily planner to remind you.  This will help you establish a routine.   2)  Organize your environment. ? Organize your environment so that it is uncluttered.  Decrease visual stimulation.  Place everyday items such as keys or cell phone in the same place every day (ie.  Basket next to front door) ? Use post it notes with a brief message to yourself (ie. Turn off light, lock the door) ? Use labels to indicate where things go (ie. Which cupboards are for food, dishes, etc.) ? Keep a notepad and pen by the telephone to take messages   3)  Memory Aids ? A diary or journal/notebook/daily planner ? Making a list (shopping list, chore list, to do list that needs to be done) ? Using an alarm as a reminder (kitchen timer or cell phone  alarm) ? Using cell phone to store information (Notes, Calendar, Reminders) ? Calendar/White board placed in a prominent position ? Post-it notes   In order for memory aids to be useful, you need to have good habits.  It's no good remembering to make a note in your journal if you don't remember to look in it.  Try setting aside a certain time of day to look in journal.   4)  Improving mood and managing fatigue. 1. There may be other factors that contribute to memory difficulties.  Factors, such as anxiety, depression and tiredness can affect memory.  Regular gentle exercise can help improve your mood and give you more energy.  Simple relaxation techniques may help relieve symptoms of anxiety  Try to get back to completing activities or hobbies you enjoyed doing in the past.  Learn to pace yourself through activities to decrease fatigue.  Find out about some local support groups where you can share experiences with others.  Try and achieve 7-8 hours of sleep at night.   Dementia Dementia is a condition that affects the way the brain functions. It often affects memory and thinking. Usually, dementia gets worse with time and cannot be reversed (progressive dementia). There are many types of dementia, including:  Alzheimer's disease. This type is the most common.  Vascular  dementia. This type may happen as the result of a stroke.  Lewy body dementia. This type may happen to people who have Parkinson's disease.  Frontotemporal dementia. This type is caused by damage to nerve cells (neurons) in certain parts of the brain. Some people may be affected by more than one type of dementia. This is called mixed dementia. What are the causes? Dementia is caused by damage to cells in the brain. The area of the brain and the types of cells damaged determine the type of dementia. Usually, this damage is irreversible or cannot be undone. Some examples of irreversible causes include:  Conditions  that affect the blood vessels of the brain, such as diabetes, heart disease, or blood vessel disease.  Genetic mutations. In some cases, changes in the brain may be caused by another condition and can be reversed or slowed. Some examples of reversible causes include:  Injury to the brain.  Certain medicines.  Infection, such as meningitis.  Metabolic problems, such as vitamin B12 deficiency or thyroid disease.  Pressure on the brain, such as from a tumor, blood clot, or too much fluid in the brain (hydrocephalus).  Autoimmune diseases that affect the brain or arteries, such as limbic encephalitis or vasculitis. What are the signs or symptoms? Symptoms of dementia depend on the type of dementia. Common signs of dementia include problems with remembering, thinking, problem solving, decision making, and communicating. These signs develop slowly or get worse with time. This may include:  Problems remembering events or people.  Having trouble taking a bath or putting clothes on.  Forgetting appointments or forgetting to pay bills.  Difficulty planning and preparing meals.  Having trouble speaking.  Getting lost easily.  Changes in behavior or mood. How is this diagnosed? This condition is diagnosed by a specialist (neurologist). It is diagnosed based on the history of your symptoms, your medical history, a physical exam, and tests. Tests may include:  Tests to evaluate brain function, such as memory tests, cognitive tests, and other tests.  Lab tests, such as blood or urine tests.  Imaging tests, such as a CT scan, a PET scan, or an MRI.  Genetic testing. This may be done if other family members have a diagnosis of certain types of dementia. Your health care provider will talk with you and your family, friends, or caregivers about your history and symptoms.   How is this treated? Treatment for this condition depends on the cause of the dementia. Progressive dementias, such as  Alzheimer's disease, cannot be cured, but there may be treatments that help to manage symptoms. Treatment might involve taking medicines that may help to:  Control the dementia.  Slow down the progression of the dementia.  Manage symptoms. In some cases, treating the cause of your dementia can improve symptoms, reverse symptoms, or slow down how quickly your dementia becomes worse. Your health care provider can direct you to support groups, organizations, and other health care providers who can help with decisions about your care. Follow these instructions at home: Medicines  Take over-the-counter and prescription medicines only as told by your health care provider.  Use a pill organizer or pill reminder to help you manage your medicines.  Avoid taking medicines that can affect thinking, such as pain medicines or sleeping medicines. Lifestyle  Make healthy lifestyle choices. ? Be physically active as told by your health care provider. ? Do not use any products that contain nicotine or tobacco, such as cigarettes, e-cigarettes, and chewing tobacco.  If you need help quitting, ask your health care provider. ? Do not drink alcohol. ? Practice stress-management techniques when you get stressed. ? Spend time with other people.  Make sure to get quality sleep. These tips can help you get a good night's rest: ? Avoid napping during the day. ? Keep your sleeping area dark and cool. ? Avoid exercising during the few hours before you go to bed. ? Avoid caffeine products in the evening. Eating and drinking  Drink enough fluid to keep your urine pale yellow.  Eat a healthy diet. General instructions  Work with your health care provider to determine what you need help with and what your safety needs are.  Talk with your health care provider about whether it is safe for you to drive.  If you were given a bracelet that identifies you as a person with memory loss or tracks your location,  make sure to wear it at all times.  Work with your family to make important decisions, such as advance directives, medical power of attorney, or a living will.  Keep all follow-up visits. This is important.   Where to find more information  Alzheimer's Association: LimitLaws.hu  [employer] on Aging: CashCowGambling.be  World Health Organization: https://castaneda-walker.com/ Contact a health care provider if:  You have any new or worsening symptoms.  You have problems with choking or swallowing. Get help right away if:  You feel depressed or sad, or feel that you want to harm yourself.  Your family members become concerned for your safety. If you ever feel like you may hurt yourself or others, or have thoughts about taking your own life, get help right away. Go to your nearest emergency department or:  Call your local emergency services (911 in the [place]  Call a suicide crisis helpline, such as the National Suicide Prevention Lifeline at 617 087 5747. This is open 24 hours a day in the [place]  Text the Crisis Text Line at (214) 386-9885 (in the [place] Summary  Dementia is a condition that affects the way the brain functions. Dementia often affects memory and thinking.  Usually, dementia gets worse with time and cannot be reversed (progressive dementia).  Treatment for this condition depends on the cause of the dementia.  Work with your health care provider to determine what you need help with and what your safety needs are.  Your health care provider can direct you to support groups, organizations, and other health care providers who can help with decisions about your care. This information is not intended to replace advice given to you by your health care provider. Make sure you discuss any questions you have with your health care provider. Document Revised: 02/22/2020 Document Reviewed: 02/22/2020 Elsevier Patient Education  2021 [employer].

## 2021-02-09 NOTE — Progress Notes (Deleted)
PATIENT: Jenny [name] DOB: Sep 30, 1932  REASON FOR VISIT: follow up HISTORY FROM: patient  No chief complaint on file.    HISTORY OF PRESENT ILLNESS: 02/09/21 ALL: Jenny [name] returns for follow up. She continues Aricept and Namenda.    01/27/2020 ALL:  Jenny [name] is a 87 y.o. female here today for follow up for dementia. She continues Aricept and Namenda. She is doing fairly well. She is now living with her sister in law who presents with her today and aids in history. She had an event last March where she was found on the floor without cloths on. She had urinated on herself and had taken her clothes off to clean up. Her sister in law found her and was able to get her up. She does not think she had fallen. There were no injuries at that time. She had fallen earlier last year and hurt her elbow. She is able to perform ADL's independently. Her sister in law cooks but she will clean up. She does not drive. Her sister in law provides medications and helps with finances. No recent falls. She plays card games on her Ipad. She watches the news. She is able to have appropriate conversation during the moment but continues to have difficulty retaining information. She is eating and drinking normally. Weight stable. Her niece and nephew live behind her on their property and are able to help out if needed.   HISTORY: (copied from Dr Trevor Mace note on 12/23/2018)  Interval history 12/23/2018: She is having difficulty with medications, but now her sister-in-law will be following more closely. She forgets to take her medications and this was for 3 days in a row. She is tolerating the Aricept. She has a positive approach. They will check in and make phone calls. She gets upset that this is happening. The family is helping with automatic deposits and payents, she has a POA. She is not cooking, feels she is doing well, she has a lot of support.   Interval history 09/29/2018: Here for follow up.  Patient was diagnosed  with mild dementia most likely due to Alzheimer's disease.  See Dr. Philis Fendt assessment August 05, 2018.  Results of cognitive testing revealed prominent deficits in learning and memory consolidation as well as some aspects of executive functioning.  These deficits are interfering with her ability to manage complex ADLs including driving, medications, appointments, finances and fits the diagnostic criteria for dementia.  No evidence of a primary psychiatric disorder.  She tried Aricept and Namenda in the past and had side effects.  Discussed trying a low-dose of Aricept again.  Interval history 6/5/20019: She is here with a friend who provides much information. She is "really getting worse". She cna;t remember what she is doing, she forgets tasks, she forgets dates and appointments, writing things down, still endorses everything in previous note (see below) from 2 years ago but worsening. She lives in an apartment. She is forgetting to take her medication. She has aphasia as well per friend. She has a lot of friends. Appears to have good insight. Sh eneeds reminders from friends. Not driving anymore, because she got lost driving, if she is someplace unfamiliar she can get lost and gets panicked. Has difficulty learning new things. She has very good insight. Friend says she notices she is withdrawing, apathy, not caring to go places also socially she is withdrawing. Had side effects to Namenda.  Dizziness with Aricept.  HPI:  Jenny [name] is a 87 y.o. female here  as a referral from Dr. Kateri Plummer for memory loss. PMHx HTN, Fibromyalgia. She doesn't remember people's names. Even people who she has known for years. Has been ongoing for at least 3 years. Has a friend here who provides much information. She has been sober for 46 years. 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 She has to write down appointments. She needs help with  finances and bills sometimes, her friend helps her. She lives in the [facility] independent [facility]. In the last 3-4 months memory loss has sped up. Sister with Alzheimers. Mother fine. 3 weeks ago she was feeling dizzy, she thought it was vertigo, she couldn;t remember how to get some pages in order for a presentation for AA. She has some depression as well and she is fearful about Alzheimers. She has an appt at [facility] for a dementia trial next month and will be following there. No other associated symptoms or modifying factors. Wory and stress is making her symptoms worse. Nothing makes them better and she can;t identify any triggers or inciting events, denies head trauma or previous illnesses. No other focal neurologic deficits or complaints.  Reviewed notes, labs and imaging from outside physicians, which showed:   B12 238, TSH .03  Personally reviewed MRI images 06/2016 and agree with the following:  FINDINGS: No evidence for acute infarction, hemorrhage, mass lesion, hydrocephalus, or extra-axial fluid. Global atrophy. Chronic microvascular ischemic change.  Flow voids are maintained. Small foci of chronic hemorrhage in the supratentorial subcortical white matter, likely sequelae of hypertensive cerebrovascular disease.  Pituitary, pineal, and cerebellar tonsils unremarkable. No upper cervical cord lesions. Prior cervical fusion.  Visualized calvarium, skull base, and upper cervical osseous structures unremarkable. Scalp and extracranial soft tissues, orbits, sinuses, and mastoids show no acute process.  Post infusion, no abnormal enhancement of the brain or meninges. Major dural venous sinuses are patent.  Compared with 2008, there is progression of both atrophy and chronic microvascular ischemic change.  IMPRESSION: Chronic changes as described. No acute intracranial abnormality. No abnormal postcontrast enhancement.  No acute cause is seen for the reported  symptoms.   REVIEW OF SYSTEMS: Out of a complete 14 system review of symptoms, the patient complains only of the following symptoms, memory loss and all other reviewed systems are negative.   ALLERGIES: Allergies  Allergen Reactions  . Benazepril Cough    Reported by PCP  . Codeine Hives  . Namenda [Memantine]     Pt cannot remember what it was but said that she had an allergic reaction to this medication.  . Statins Other (See Comments)    Took zocor- had pain all over and developed rashes on body  . Sulfa Antibiotics Hives and Nausea Only  . Adhesive [Tape] Rash    Skin irritation- blisters    HOME MEDICATIONS:/ Outpatient Medications Prior to Visit  Medication Sig Dispense Refill  . cetirizine (ZYRTEC) 10 MG tablet Take 10 mg by mouth daily.    . Cholecalciferol (VITAMIN D3 PO) Take 2,000 Units by mouth daily.     . diclofenac sodium (VOLTAREN) 1 % GEL Apply 4 g topically 4 (four) times daily as needed. 500 g 6  . donepezil (ARICEPT) 10 MG tablet Take one tablet daily 90 tablet 2  . ferrous sulfate 325 (65 FE) MG tablet Take 325 mg by mouth daily with breakfast.    . levothyroxine (SYNTHROID) 175 MCG tablet Take  175 mcg by mouth daily before breakfast. Omit Sunday dose    . losartan (COZAAR) 25 MG tablet Take 25 mg by mouth daily.    . meloxicam (MOBIC) 7.5 MG tablet TAKE 1 TABLET BY MOUTH DAILY FOR TEN DAYS 15 tablet 0  . meloxicam (MOBIC) 7.5 MG tablet 1 po q d prn pain 30 tablet 0  . memantine (NAMENDA) 10 MG tablet TAKE ONE TABLET TWICE DAILY 180 tablet 3  . Multiple Vitamins-Minerals (MULTIVITAMIN PO) Take by mouth.    . sertraline (ZOLOFT) 100 MG tablet Take 100 mg by mouth daily.    Marland Kitchen triamcinolone cream (KENALOG) 0.1 % Apply 1 application topically as needed. 80 g 6  . vitamin B-12 (CYANOCOBALAMIN) 1000 MCG tablet Take 1,000 mcg by mouth. Takes 3 x a week     No facility-administered medications prior to visit.    PAST MEDICAL HISTORY: Past Medical History:   Diagnosis Date  . B12 deficiency   . Back pain   . Dementia due to Alzheimer's disease (HCC)   . Dysphagia   . Fibromyalgia   . Hypertension   . Hypothyroidism   . Lung nodule   . Memory changes   . Moderate depressive episode (HCC)   . Osteopenia   . Pharyngoesophageal dysphagia   . Thyroid disease   . Urinary incontinence    stress incontinence per pt   . Vitamin D deficiency   . Weight loss     PAST SURGICAL HISTORY: Past Surgical History:  Procedure Laterality Date  . APPENDECTOMY    . BACK SURGERY     lumbar   . CATARACT EXTRACTION Right   . SALPINGOOPHORECTOMY    . VAGINAL HYSTERECTOMY      FAMILY HISTORY: Family History  Problem Relation Age of Onset  . Pneumonia Mother   . Heart Problems Father   . Alzheimer's disease Sister   . Heart Problems Other   . Heart Problems Sister     SOCIAL HISTORY: Social History   Socioeconomic History  . Marital status: Widowed    Spouse name: Not on file  . Number of children: 0  . Years of education: 50  . Highest education level: Not on file  Occupational History  . Occupation: Retired  Tobacco Use  . Smoking status: Former Games developer  . Smokeless tobacco: Never Used  Vaping Use  . Vaping Use: Never used  Substance and Sexual Activity  . Alcohol use: No  . Drug use: No  . Sexual activity: Not on file  Other Topics Concern  . Not on file  Social History Narrative   Lives alone   Caffeine use: sometimes   Right handed   Social Determinants of Health   Financial Resource Strain: Not on file  Food Insecurity: Not on file  Transportation Needs: Not on file  Physical Activity: Not on file  Stress: Not on file  Social Connections: Not on file  Intimate Partner Violence: Not on file      PHYSICAL EXAM  There were no vitals filed for this visit. There is no height or weight on file to calculate BMI.  Generalized: Well developed, in no acute distress  Cardiology: normal rate and rhythm, no murmur  noted Respiratory: clear to auscultation bilaterally  Neurological examination  Mentation: Alert oriented to time, place, history taking. Follows all commands speech and language fluent Cranial nerve II-XII: Pupils were equal round reactive to light. Extraocular movements were full, visual field were full on confrontational test. Facial  sensation and strength were normal. Uvula tongue midline. Head turning and shoulder shrug  were normal and symmetric. Motor: The motor testing reveals 5 over 5 strength of all 4 extremities. Good symmetric motor tone is noted throughout.  Sensory: Sensory testing is intact to soft touch on all 4 extremities. No evidence of extinction is noted.  Coordination: Cerebellar testing reveals good finger-nose-finger and heel-to-shin bilaterally.  Gait and station: Gait is normal.  Reflexes: Deep tendon reflexes are symmetric and normal bilaterally.   DIAGNOSTIC DATA (LABS, IMAGING, TESTING) - I reviewed patient records, labs, notes, testing and imaging myself where available.  MMSE - Mini Mental State Exam 01/27/2020 03/26/2018 07/19/2016  Orientation to time 1 2 4   Orientation to Place 4 5 5   Registration 3 3 3   Attention/ Calculation 5 5 5   Recall 2 3 3   Language- name 2 objects 2 2 2   Language- repeat 1 1 1   Language- follow 3 step command 3 3 3   Language- read & follow direction 1 1 1   Write a sentence 1 1 1   Copy design 1 1 1   Total score 24 27 29      No results found for: WBC, HGB, HCT, MCV, PLT    Component Value Date/Time   NA 142 03/26/2018 1141   K 4.6 03/26/2018 1141   CL 100 03/26/2018 1141   CO2 28 03/26/2018 1141   GLUCOSE 78 03/26/2018 1141   BUN 10 03/26/2018 1141   CREATININE 0.85 03/26/2018 1141   CALCIUM 10.5 (H) 03/26/2018 1141   GFRNONAA 63 03/26/2018 1141   GFRAA 73 03/26/2018 1141   No results found for: CHOL, HDL, LDLCALC, LDLDIRECT, TRIG, CHOLHDL No results found for: [id number] Lab Results  Component Value Date   VITAMINB12 344  03/26/2018   Lab Results  Component Value Date   TSH 32.930 (H) 03/26/2018     ASSESSMENT AND PLAN 87 y.o. year old female  has a past medical history of B12 deficiency, Back pain, Dementia due to Alzheimer's disease (HCC), Dysphagia, Fibromyalgia, Hypertension, Hypothyroidism, Lung nodule, Memory changes, Moderate depressive episode (HCC), Osteopenia, Pharyngoesophageal dysphagia, Thyroid disease, Urinary incontinence, Vitamin D deficiency, and Weight loss. here with   No diagnosis found.   Talbert ForestShirley is doing very well. She continues Aricept 10mg  daily and Namenda10mg  twice daily and is tolerating well. We will continue current therapy. MMSE 24/30 with initial assessment and deficit with orientation to time. After we talked for a bit, I repeated orientation questions and she was able to answer all correctly with exception of day of the week and specific date. Score would have been 27/30 which is consistent with last MMSE in 2019. She was encouraged to stay physically and mentally active. Adequate hydration and healthy lifestyle habits encouraged. She will continue close follow up with PCP. Fall and safety precautions reviewed. She will follow up with us in 1 year, sooner if needed. She and her sister in law verbalizes understanding and agreement with this plan.    No orders of the defined types were placed in this encounter.    No orders of the defined types were placed in this encounter.     I spent 25 minutes with the patient. 50% of this time was spent counseling and educating patient on plan of care and medications.    Shawnie Dappermy Kirat Mezquita, FNP-C 02/09/2021, 8:31 AM [facility] [address], [address] [place], [place] [address] [phone]

## 2021-02-09 NOTE — Telephone Encounter (Signed)
..   Pt understands that although there may be some limitations with this type of visit, we will take all precautions to reduce any security or privacy concerns.  Pt understands that this will be treated like an in office visit and we will file with pt's insurance, and there may be a patient responsible charge related to this service. ? ?

## 2021-02-13 ENCOUNTER — Telehealth: Payer: Self-pay | Admitting: Family Medicine

## 2021-02-13 ENCOUNTER — Telehealth: Payer: Medicare PPO | Admitting: Family Medicine

## 2021-02-13 NOTE — Telephone Encounter (Signed)
Amy is leaving early, LVM and sent MyChart message asking pt to call us back to r/s.

## 2021-02-13 NOTE — Progress Notes (Deleted)
PATIENT: Jenny [name] DOB: [birth year]  REASON FOR VISIT: follow up HISTORY FROM: patient  No chief complaint on file.    HISTORY OF PRESENT ILLNESS: 02/13/21 ALL: Jenny [name] returns for follow up. She continues Aricept and Namenda.    01/27/2020 ALL:  Jenny [name] is a 87 y.o. female here today for follow up for dementia. She continues Aricept and Namenda. She is doing fairly well. She is now living with her sister in law who presents with her today and aids in history. She had an event last March where she was found on the floor without cloths on. She had urinated on herself and had taken her clothes off to clean up. Her sister in law found her and was able to get her up. She does not think she had fallen. There were no injuries at that time. She had fallen earlier last year and hurt her elbow. She is able to perform ADL's independently. Her sister in law cooks but she will clean up. She does not drive. Her sister in law provides medications and helps with finances. No recent falls. She plays card games on her Ipad. She watches the news. She is able to have appropriate conversation during the moment but continues to have difficulty retaining information. She is eating and drinking normally. Weight stable. Her niece and nephew live behind her on their property and are able to help out if needed.   HISTORY: (copied from Dr Trevor Mace note on 12/23/2018)  Interval history 12/23/2018: She is having difficulty with medications, but now her sister-in-law will be following more closely. She forgets to take her medications and this was for 3 days in a row. She is tolerating the Aricept. She has a positive approach. They will check in and make phone calls. She gets upset that this is happening. The family is helping with automatic deposits and payents, she has a POA. She is not cooking, feels she is doing well, she has a lot of support.   Interval history 09/29/2018: Here for follow up.  Patient was diagnosed  with mild dementia most likely due to Alzheimer's disease.  See Dr. Philis Fendt assessment August 05, 2018.  Results of cognitive testing revealed prominent deficits in learning and memory consolidation as well as some aspects of executive functioning.  These deficits are interfering with her ability to manage complex ADLs including driving, medications, appointments, finances and fits the diagnostic criteria for dementia.  No evidence of a primary psychiatric disorder.  She tried Aricept and Namenda in the past and had side effects.  Discussed trying a low-dose of Aricept again.  Interval history 6/5/20019: She is here with a friend who provides much information. She is "really getting worse". She cna;t remember what she is doing, she forgets tasks, she forgets dates and appointments, writing things down, still endorses everything in previous note (see below) from 2 years ago but worsening. She lives in an apartment. She is forgetting to take her medication. She has aphasia as well per friend. She has a lot of friends. Appears to have good insight. Sh eneeds reminders from friends. Not driving anymore, because she got lost driving, if she is someplace unfamiliar she can get lost and gets panicked. Has difficulty learning new things. She has very good insight. Friend says she notices she is withdrawing, apathy, not caring to go places also socially she is withdrawing. Had side effects to Namenda.  Dizziness with Aricept.  HPI:  Jenny [name] is a 87 y.o. female here  as a referral from Dr. Kateri Plummer for memory loss. PMHx HTN, Fibromyalgia. She doesn't remember people's names. Even people who she has known for years. Has been ongoing for at least 3 years. Has a friend here who provides much information. She has been sober for 46 years. 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 She has to write down appointments. She needs help with  finances and bills sometimes, her friend helps her. She lives in the [facility] independent [facility]. In the last 3-4 months memory loss has sped up. Sister with Alzheimers. Mother fine. 3 weeks ago she was feeling dizzy, she thought it was vertigo, she couldn;t remember how to get some pages in order for a presentation for AA. She has some depression as well and she is fearful about Alzheimers. She has an appt at [facility] for a dementia trial next month and will be following there. No other associated symptoms or modifying factors. Wory and stress is making her symptoms worse. Nothing makes them better and she can;t identify any triggers or inciting events, denies head trauma or previous illnesses. No other focal neurologic deficits or complaints.  Reviewed notes, labs and imaging from outside physicians, which showed:   B12 238, TSH .03  Personally reviewed MRI images 06/2016 and agree with the following:  FINDINGS: No evidence for acute infarction, hemorrhage, mass lesion, hydrocephalus, or extra-axial fluid. Global atrophy. Chronic microvascular ischemic change.  Flow voids are maintained. Small foci of chronic hemorrhage in the supratentorial subcortical white matter, likely sequelae of hypertensive cerebrovascular disease.  Pituitary, pineal, and cerebellar tonsils unremarkable. No upper cervical cord lesions. Prior cervical fusion.  Visualized calvarium, skull base, and upper cervical osseous structures unremarkable. Scalp and extracranial soft tissues, orbits, sinuses, and mastoids show no acute process.  Post infusion, no abnormal enhancement of the brain or meninges. Major dural venous sinuses are patent.  Compared with 2008, there is progression of both atrophy and chronic microvascular ischemic change.  IMPRESSION: Chronic changes as described. No acute intracranial abnormality. No abnormal postcontrast enhancement.  No acute cause is seen for the reported  symptoms.   REVIEW OF SYSTEMS: Out of a complete 14 system review of symptoms, the patient complains only of the following symptoms, memory loss and all other reviewed systems are negative.   ALLERGIES: Allergies  Allergen Reactions  . Benazepril Cough    Reported by PCP  . Codeine Hives  . Namenda [Memantine]     Pt cannot remember what it was but said that she had an allergic reaction to this medication.  . Statins Other (See Comments)    Took zocor- had pain all over and developed rashes on body  . Sulfa Antibiotics Hives and Nausea Only  . Adhesive [Tape] Rash    Skin irritation- blisters    HOME MEDICATIONS:/ Outpatient Medications Prior to Visit  Medication Sig Dispense Refill  . cetirizine (ZYRTEC) 10 MG tablet Take 10 mg by mouth daily.    . Cholecalciferol (VITAMIN D3 PO) Take 2,000 Units by mouth daily.     . diclofenac sodium (VOLTAREN) 1 % GEL Apply 4 g topically 4 (four) times daily as needed. 500 g 6  . donepezil (ARICEPT) 10 MG tablet Take one tablet daily 90 tablet 2  . ferrous sulfate 325 (65 FE) MG tablet Take 325 mg by mouth daily with breakfast.    . levothyroxine (SYNTHROID) 175 MCG tablet Take  175 mcg by mouth daily before breakfast. Omit Sunday dose    . losartan (COZAAR) 25 MG tablet Take 25 mg by mouth daily.    . meloxicam (MOBIC) 7.5 MG tablet TAKE 1 TABLET BY MOUTH DAILY FOR TEN DAYS 15 tablet 0  . meloxicam (MOBIC) 7.5 MG tablet 1 po q d prn pain 30 tablet 0  . memantine (NAMENDA) 10 MG tablet TAKE ONE TABLET TWICE DAILY 180 tablet 3  . Multiple Vitamins-Minerals (MULTIVITAMIN PO) Take by mouth.    . sertraline (ZOLOFT) 100 MG tablet Take 100 mg by mouth daily.    Marland Kitchen triamcinolone cream (KENALOG) 0.1 % Apply 1 application topically as needed. 80 g 6  . vitamin B-12 (CYANOCOBALAMIN) 1000 MCG tablet Take 1,000 mcg by mouth. Takes 3 x a week     No facility-administered medications prior to visit.    PAST MEDICAL HISTORY: Past Medical History:   Diagnosis Date  . B12 deficiency   . Back pain   . Dementia due to Alzheimer's disease (HCC)   . Dysphagia   . Fibromyalgia   . Hypertension   . Hypothyroidism   . Lung nodule   . Memory changes   . Moderate depressive episode (HCC)   . Osteopenia   . Pharyngoesophageal dysphagia   . Thyroid disease   . Urinary incontinence    stress incontinence per pt   . Vitamin D deficiency   . Weight loss     PAST SURGICAL HISTORY: Past Surgical History:  Procedure Laterality Date  . APPENDECTOMY    . BACK SURGERY     lumbar   . CATARACT EXTRACTION Right   . SALPINGOOPHORECTOMY    . VAGINAL HYSTERECTOMY      FAMILY HISTORY: Family History  Problem Relation Age of Onset  . Pneumonia Mother   . Heart Problems Father   . Alzheimer's disease Sister   . Heart Problems Other   . Heart Problems Sister     SOCIAL HISTORY: Social History   Socioeconomic History  . Marital status: Widowed    Spouse name: Not on file  . Number of children: 0  . Years of education: 50  . Highest education level: Not on file  Occupational History  . Occupation: Retired  Tobacco Use  . Smoking status: Former Games developer  . Smokeless tobacco: Never Used  Vaping Use  . Vaping Use: Never used  Substance and Sexual Activity  . Alcohol use: No  . Drug use: No  . Sexual activity: Not on file  Other Topics Concern  . Not on file  Social History Narrative   Lives alone   Caffeine use: sometimes   Right handed   Social Determinants of Health   Financial Resource Strain: Not on file  Food Insecurity: Not on file  Transportation Needs: Not on file  Physical Activity: Not on file  Stress: Not on file  Social Connections: Not on file  Intimate Partner Violence: Not on file      PHYSICAL EXAM  There were no vitals filed for this visit. There is no height or weight on file to calculate BMI.  Generalized: Well developed, in no acute distress  Cardiology: normal rate and rhythm, no murmur  noted Respiratory: clear to auscultation bilaterally  Neurological examination  Mentation: Alert oriented to time, place, history taking. Follows all commands speech and language fluent Cranial nerve II-XII: Pupils were equal round reactive to light. Extraocular movements were full, visual field were full on confrontational test. Facial  sensation and strength were normal. Uvula tongue midline. Head turning and shoulder shrug  were normal and symmetric. Motor: The motor testing reveals 5 over 5 strength of all 4 extremities. Good symmetric motor tone is noted throughout.  Sensory: Sensory testing is intact to soft touch on all 4 extremities. No evidence of extinction is noted.  Coordination: Cerebellar testing reveals good finger-nose-finger and heel-to-shin bilaterally.  Gait and station: Gait is normal.  Reflexes: Deep tendon reflexes are symmetric and normal bilaterally.   DIAGNOSTIC DATA (LABS, IMAGING, TESTING) - I reviewed patient records, labs, notes, testing and imaging myself where available.  MMSE - Mini Mental State Exam 01/27/2020 03/26/2018 07/19/2016  Orientation to time 1 2 4   Orientation to Place 4 5 5   Registration 3 3 3   Attention/ Calculation 5 5 5   Recall 2 3 3   Language- name 2 objects 2 2 2   Language- repeat 1 1 1   Language- follow 3 step command 3 3 3   Language- read & follow direction 1 1 1   Write a sentence 1 1 1   Copy design 1 1 1   Total score 24 27 29      No results found for: WBC, HGB, HCT, MCV, PLT    Component Value Date/Time   NA 142 03/26/2018 1141   K 4.6 03/26/2018 1141   CL 100 03/26/2018 1141   CO2 28 03/26/2018 1141   GLUCOSE 78 03/26/2018 1141   BUN 10 03/26/2018 1141   CREATININE 0.85 03/26/2018 1141   CALCIUM 10.5 (H) 03/26/2018 1141   GFRNONAA 63 03/26/2018 1141   GFRAA 73 03/26/2018 1141   No results found for: CHOL, HDL, LDLCALC, LDLDIRECT, TRIG, CHOLHDL No results found for: Lab Results  Component Value Date   VITAMINB12 344  03/26/2018   Lab Results  Component Value Date   TSH 32.930 (H) 03/26/2018     ASSESSMENT AND PLAN 87 y.o. year old female  has a past medical history of B12 deficiency, Back pain, Dementia due to Alzheimer's disease (HCC), Dysphagia, Fibromyalgia, Hypertension, Hypothyroidism, Lung nodule, Memory changes, Moderate depressive episode (HCC), Osteopenia, Pharyngoesophageal dysphagia, Thyroid disease, Urinary incontinence, Vitamin D deficiency, and Weight loss. here with   No diagnosis found.   Jenny [name] is doing very well. She continues Aricept 10mg  daily and Namenda10mg  twice daily and is tolerating well. We will continue current therapy. MMSE 24/30 with initial assessment and deficit with orientation to time. After we talked for a bit, I repeated orientation questions and she was able to answer all correctly with exception of day of the week and specific date. Score would have been 27/30 which is consistent with last MMSE in 2019. She was encouraged to stay physically and mentally active. Adequate hydration and healthy lifestyle habits encouraged. She will continue close follow up with PCP. Fall and safety precautions reviewed. She will follow up with 05/26/2018 in 1 year, sooner if needed. She and her sister in law verbalizes understanding and agreement with this plan.    No orders of the defined types were placed in this encounter.    No orders of the defined types were placed in this encounter.     I spent 25 minutes with the patient. 50% of this time was spent counseling and educating patient on plan of care and medications.    05/26/2018 02/13/2021, 12:33 PM [facility] [address], [address] [place], [id number] 05/26/2018 [phone]

## 2021-02-14 ENCOUNTER — Telehealth: Payer: Self-pay | Admitting: Family Medicine

## 2021-02-14 ENCOUNTER — Telehealth: Payer: Medicare PPO | Admitting: Family Medicine

## 2021-02-14 DIAGNOSIS — F0280 Dementia in other diseases classified elsewhere without behavioral disturbance: Secondary | ICD-10-CM

## 2021-02-14 NOTE — Telephone Encounter (Signed)
..   Pt understands that although there may be some limitations with this type of visit, we will take all precautions to reduce any security or privacy concerns.  Pt understands that this will be treated like an in office visit and we will file with pt's insurance, and there may be a patient responsible charge related to this service. ? ?

## 2021-03-01 ENCOUNTER — Other Ambulatory Visit: Payer: Self-pay | Admitting: Neurology

## 2021-03-02 ENCOUNTER — Telehealth: Payer: Self-pay | Admitting: Family Medicine

## 2021-03-02 NOTE — Telephone Encounter (Signed)
Pt's sister [name] called stating that they are having trouble filling the pt's memantine (NAMENDA) 10 MG tablet at the [facility]. Pt is scheduled 05/09/21 and she is wanting to know if they can get that filled for the pt. Please advise.

## 2021-03-02 NOTE — Telephone Encounter (Signed)
Jenny [name]@ [facility] is asking for a call from RN to discuss pt's donepezil (ARICEPT) 10 MG tablet

## 2021-03-02 NOTE — Telephone Encounter (Signed)
See other telephone note dated today.  LVM for sister.

## 2021-03-02 NOTE — Telephone Encounter (Signed)
Called and LMVM for sister of pt.  ? Not able to reach pharmacy at this time.  Call to local pharmacy?? [facility]?

## 2021-03-02 NOTE — Telephone Encounter (Signed)
Called [facility], spoke with Regetta who stated they got rejection on refill for aricept, code stated patient is not seen here. I advised her that is incorrect. She will refill x 90 days until patient can be seen at scheduled FU in July. She stated they did get refill for namenda. Called sister and LVM advising her that both namanda and aricept refills have been sent in today. Left # for questions.

## 2021-04-13 DIAGNOSIS — S0083XA Contusion of other part of head, initial encounter: Secondary | ICD-10-CM | POA: Diagnosis not present

## 2021-04-13 DIAGNOSIS — Z90710 Acquired absence of both cervix and uterus: Secondary | ICD-10-CM | POA: Diagnosis not present

## 2021-04-13 DIAGNOSIS — Z9049 Acquired absence of other specified parts of digestive tract: Secondary | ICD-10-CM | POA: Diagnosis not present

## 2021-04-13 DIAGNOSIS — Z79899 Other long term (current) drug therapy: Secondary | ICD-10-CM | POA: Diagnosis not present

## 2021-04-13 DIAGNOSIS — S0990XA Unspecified injury of head, initial encounter: Secondary | ICD-10-CM | POA: Diagnosis not present

## 2021-04-13 DIAGNOSIS — S8012XA Contusion of left lower leg, initial encounter: Secondary | ICD-10-CM | POA: Diagnosis not present

## 2021-04-13 DIAGNOSIS — S199XXA Unspecified injury of neck, initial encounter: Secondary | ICD-10-CM | POA: Diagnosis not present

## 2021-04-13 DIAGNOSIS — S8992XA Unspecified injury of left lower leg, initial encounter: Secondary | ICD-10-CM | POA: Diagnosis not present

## 2021-04-13 DIAGNOSIS — S0003XA Contusion of scalp, initial encounter: Secondary | ICD-10-CM | POA: Diagnosis not present

## 2021-04-13 DIAGNOSIS — S8002XA Contusion of left knee, initial encounter: Secondary | ICD-10-CM | POA: Diagnosis not present

## 2021-04-13 DIAGNOSIS — I10 Essential (primary) hypertension: Secondary | ICD-10-CM | POA: Diagnosis not present

## 2021-04-13 DIAGNOSIS — M542 Cervicalgia: Secondary | ICD-10-CM | POA: Diagnosis not present

## 2021-04-14 DIAGNOSIS — M25532 Pain in left wrist: Secondary | ICD-10-CM | POA: Diagnosis not present

## 2021-04-15 DIAGNOSIS — F32A Depression, unspecified: Secondary | ICD-10-CM | POA: Diagnosis not present

## 2021-04-15 DIAGNOSIS — G309 Alzheimer's disease, unspecified: Secondary | ICD-10-CM | POA: Diagnosis not present

## 2021-04-15 DIAGNOSIS — F0280 Dementia in other diseases classified elsewhere without behavioral disturbance: Secondary | ICD-10-CM | POA: Diagnosis not present

## 2021-04-15 DIAGNOSIS — I10 Essential (primary) hypertension: Secondary | ICD-10-CM | POA: Diagnosis not present

## 2021-04-26 IMAGING — MR MR LUMBAR SPINE W/O CM
4 of 5 series · 26 of 48 positions shown · non-contrast
Comparison: None.

CLINICAL DATA: Radicular left lower extremity pain

EXAM:
MRI LUMBAR SPINE WITHOUT CONTRAST
TECHNIQUE: Multiplanar, multisequence MR imaging of the lumbar spine was
performed. No intravenous contrast was administered.

[Series 2: T2 · sagittal · 4.0mm · 1.09mm/px · 5 of 17 slices shown (1 of 2)]
[im 1/17]
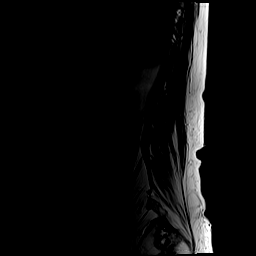
[im 5/17]
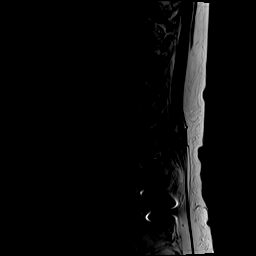
[im 9/17]
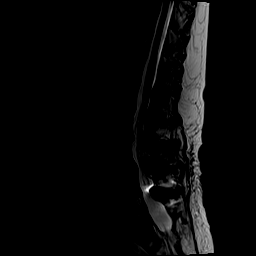
[im 13/17]
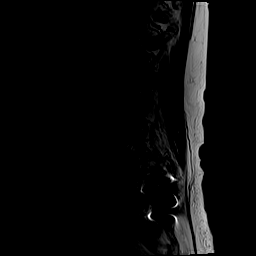
[im 17/17]
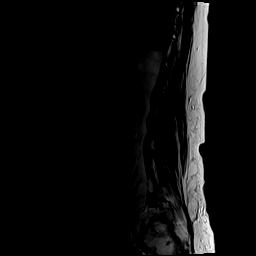

[Series 4: T1 · sagittal · 4.0mm · 1.09mm/px · 6 of 17 slices shown (1 of 2)]
[im 1/17]
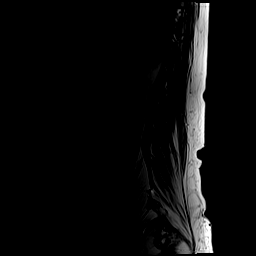
[im 4/17]
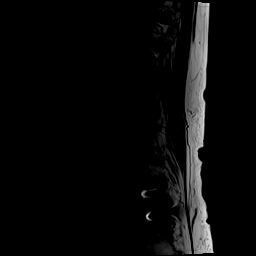
[im 7/17]
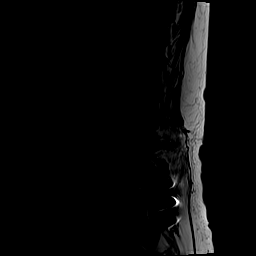
[im 10/17]
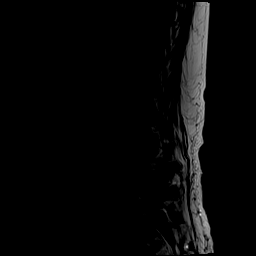
[im 13/17]
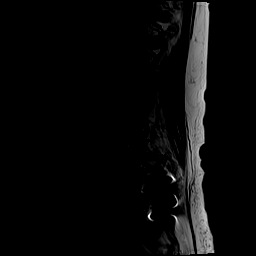
[im 17/17]
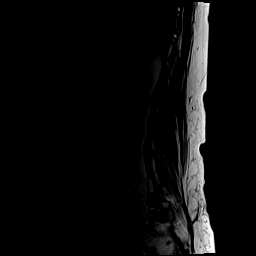

[Series 5: T2 · axial · 4.0mm · 0.39mm/px · z∈[-63,+160]mm · 10 of 48 slices shown (2 of 2)]
[im 4/48]
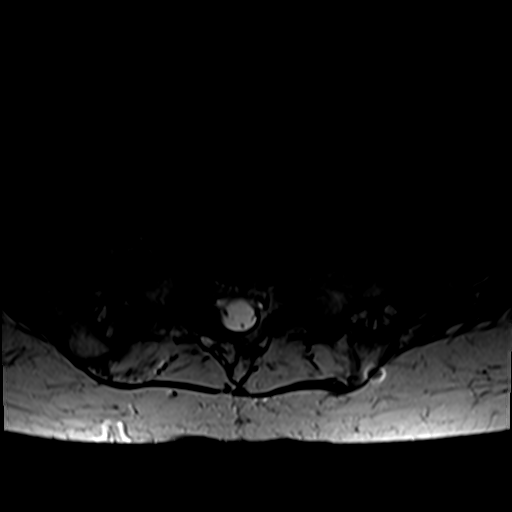
[im 7/48]
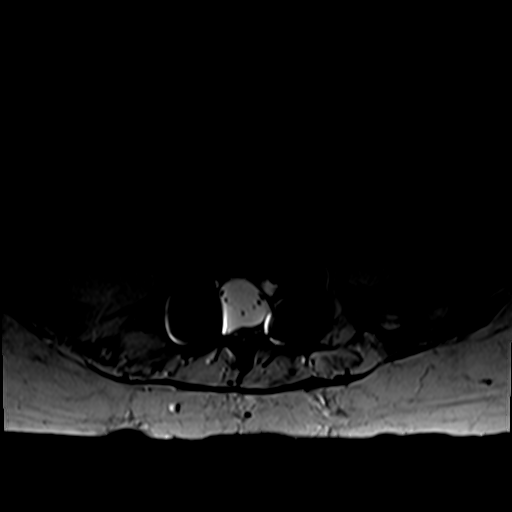
[im 10/48]
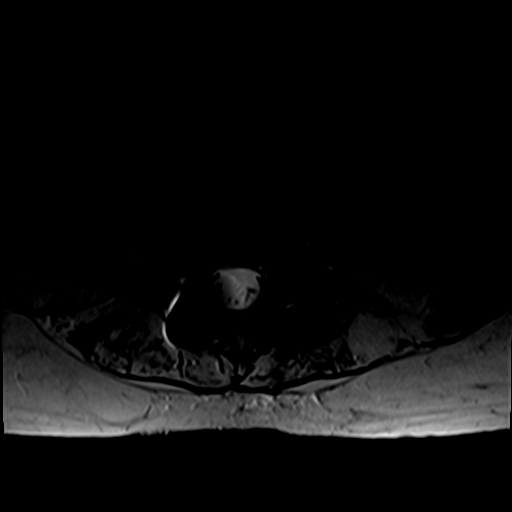
[im 16/48]
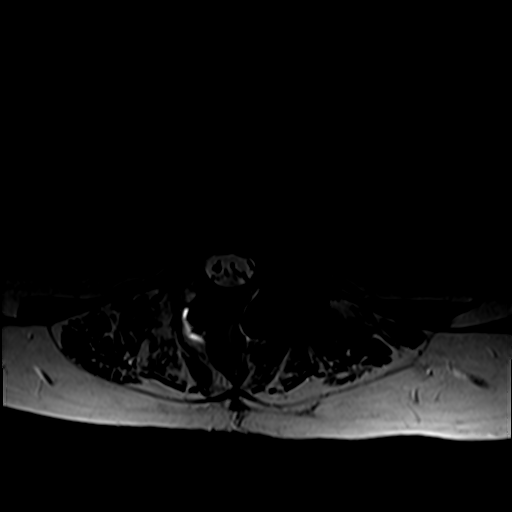
[im 22/48]
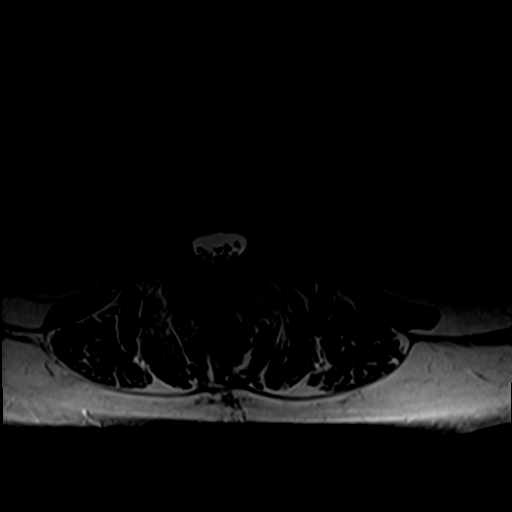
[im 26/48]
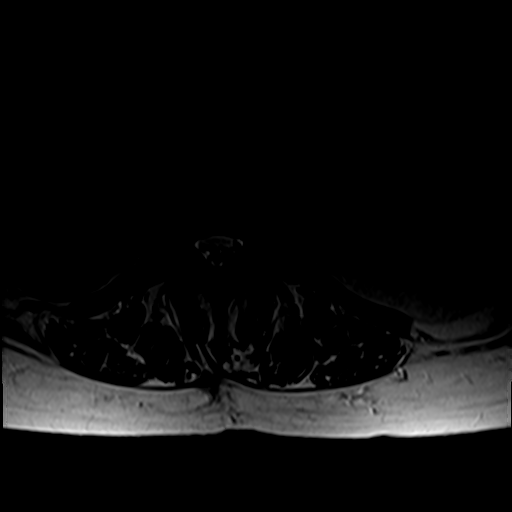
[im 29/48]
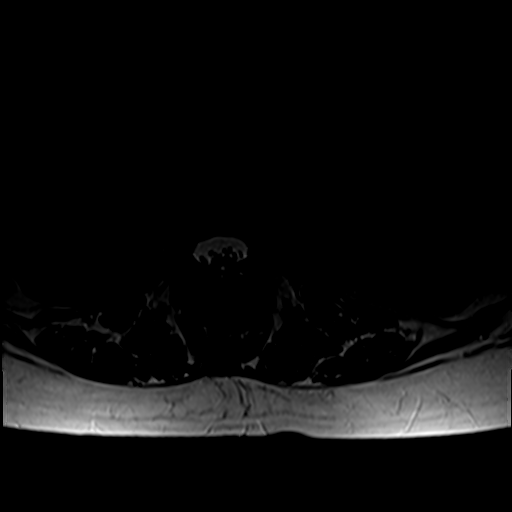
[im 35/48]
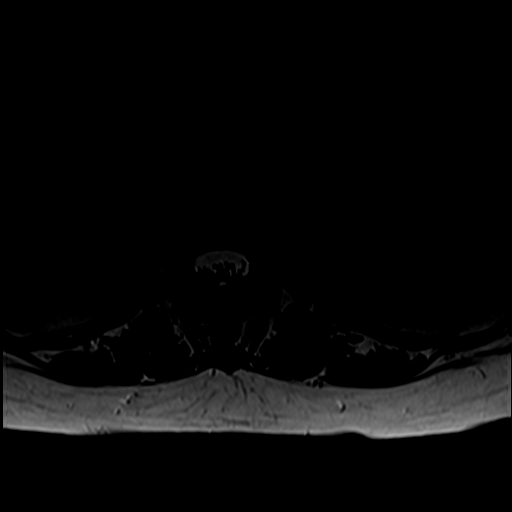
[im 41/48]
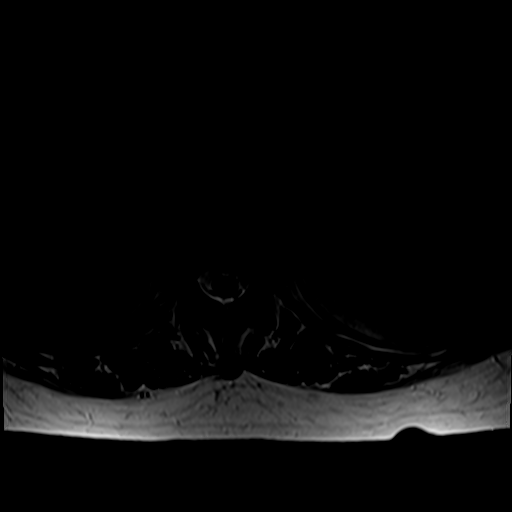
[im 48/48]
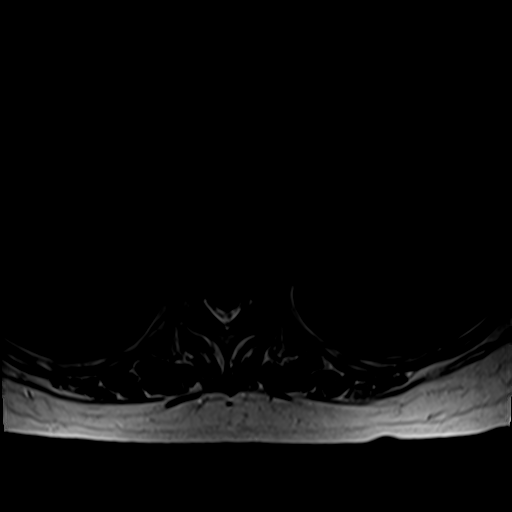

[Series 6: T1 · axial · 4.0mm · 0.39mm/px · z∈[-63,+127]mm · 5 of 48 slices shown (2 of 2)]
[im 4/48]
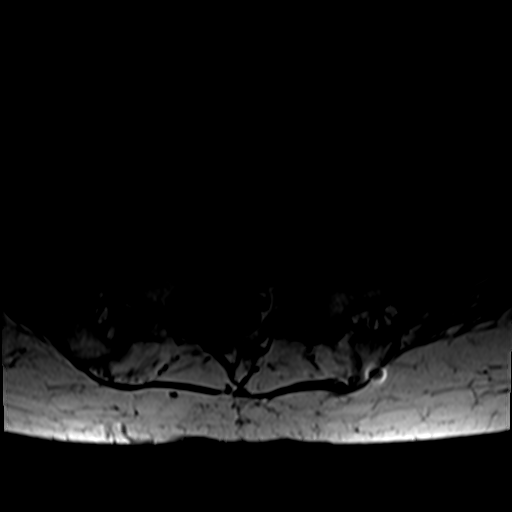
[im 7/48]
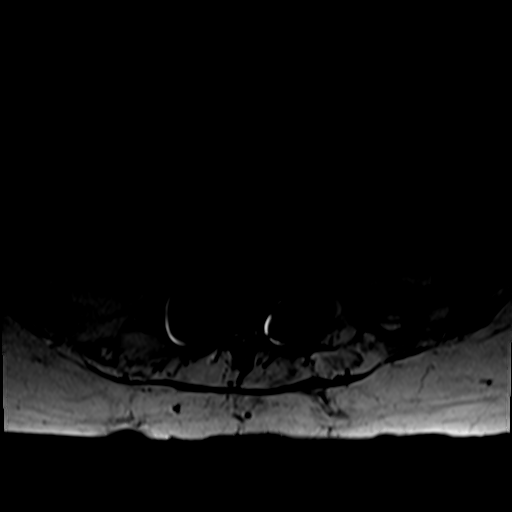
[im 10/48]
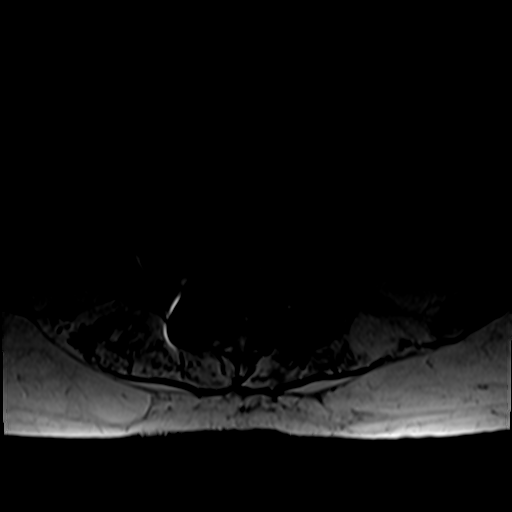
[im 26/48]
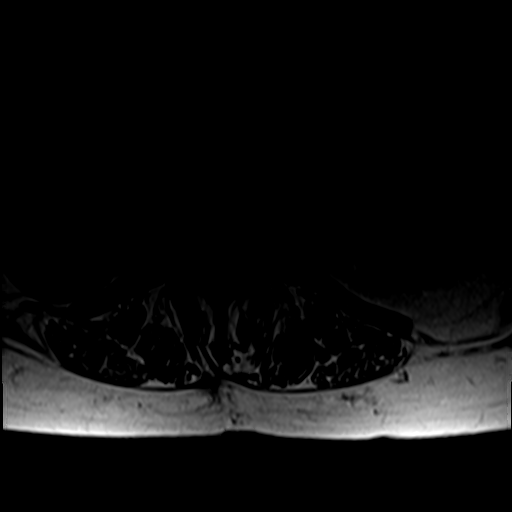
[im 41/48]
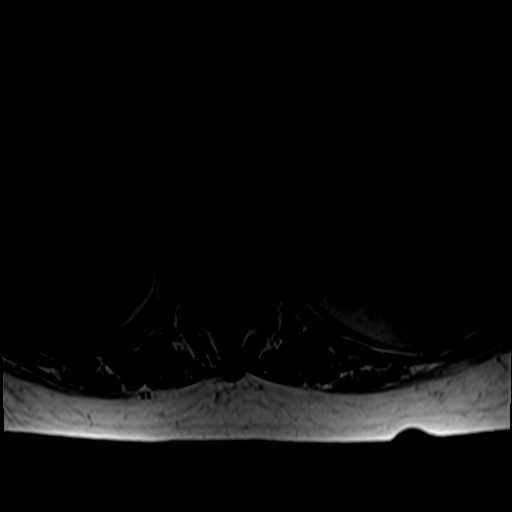

[26 of 48 positions shown; findings below may reference images not displayed]

FINDINGS: Segmentation:  Standard

Alignment:  Grade 1 anterolisthesis at L3-4

Vertebrae: There are screws in the posterior elements at the levels
of the L4-5 and L5-S1 discs.

Conus medullaris and cauda equina: Conus extends to the L1 level.
Conus and cauda equina appear normal.

Paraspinal and other soft tissues: Negative

Disc levels:

T11-12: Normal.

T12-L1: Normal.

L1-L2: Disc desiccation without herniation. No spinal canal
stenosis. No neural foraminal stenosis.

L2-L3: Disc desiccation with mild bulge and mild facet hypertrophy.
No spinal canal stenosis. No neural foraminal stenosis.

L3-L4: Disc desiccation with moderate facet hypertrophy and small
disc bulge. No spinal canal stenosis. No neural foraminal stenosis.

L4-L5: Postsurgical changes. No disc herniation. Mild disc height
loss. No spinal canal stenosis. No neural foraminal stenosis.

L5-S1: A severe disc space narrowing without herniation. No spinal
canal stenosis. No neural foraminal stenosis.

Visualized sacrum: Normal.
IMPRESSION: 1. Posterior instrumentation at the levels of the L4-5 and L5-S1
discs.
2. Moderate L3-4 facet arthrosis with grade 1 anterolisthesis.
3. No spinal canal or neural foraminal stenosis.

## 2021-05-09 ENCOUNTER — Telehealth: Payer: Medicare PPO | Admitting: Family Medicine

## 2021-05-23 DIAGNOSIS — D649 Anemia, unspecified: Secondary | ICD-10-CM | POA: Diagnosis not present

## 2021-05-23 DIAGNOSIS — E039 Hypothyroidism, unspecified: Secondary | ICD-10-CM | POA: Diagnosis not present

## 2021-05-23 DIAGNOSIS — I10 Essential (primary) hypertension: Secondary | ICD-10-CM | POA: Diagnosis not present

## 2021-05-23 DIAGNOSIS — G309 Alzheimer's disease, unspecified: Secondary | ICD-10-CM | POA: Diagnosis not present

## 2021-05-23 DIAGNOSIS — E785 Hyperlipidemia, unspecified: Secondary | ICD-10-CM | POA: Diagnosis not present

## 2021-08-24 DIAGNOSIS — M549 Dorsalgia, unspecified: Secondary | ICD-10-CM | POA: Diagnosis not present

## 2021-08-24 DIAGNOSIS — Z23 Encounter for immunization: Secondary | ICD-10-CM | POA: Diagnosis not present

## 2021-08-24 DIAGNOSIS — G8929 Other chronic pain: Secondary | ICD-10-CM | POA: Diagnosis not present

## 2021-08-24 DIAGNOSIS — S99922A Unspecified injury of left foot, initial encounter: Secondary | ICD-10-CM | POA: Diagnosis not present

## 2021-08-24 DIAGNOSIS — E039 Hypothyroidism, unspecified: Secondary | ICD-10-CM | POA: Diagnosis not present

## 2021-08-24 DIAGNOSIS — R52 Pain, unspecified: Secondary | ICD-10-CM | POA: Diagnosis not present

## 2021-08-24 DIAGNOSIS — E785 Hyperlipidemia, unspecified: Secondary | ICD-10-CM | POA: Diagnosis not present

## 2021-08-24 DIAGNOSIS — I10 Essential (primary) hypertension: Secondary | ICD-10-CM | POA: Diagnosis not present

## 2021-11-24 DIAGNOSIS — G8929 Other chronic pain: Secondary | ICD-10-CM | POA: Diagnosis not present

## 2021-11-24 DIAGNOSIS — J309 Allergic rhinitis, unspecified: Secondary | ICD-10-CM | POA: Diagnosis not present

## 2021-11-24 DIAGNOSIS — M549 Dorsalgia, unspecified: Secondary | ICD-10-CM | POA: Diagnosis not present

## 2021-11-24 DIAGNOSIS — E876 Hypokalemia: Secondary | ICD-10-CM | POA: Diagnosis not present

## 2021-11-24 DIAGNOSIS — E785 Hyperlipidemia, unspecified: Secondary | ICD-10-CM | POA: Diagnosis not present

## 2021-11-24 DIAGNOSIS — E039 Hypothyroidism, unspecified: Secondary | ICD-10-CM | POA: Diagnosis not present

## 2021-11-24 DIAGNOSIS — I10 Essential (primary) hypertension: Secondary | ICD-10-CM | POA: Diagnosis not present

## 2022-02-23 DIAGNOSIS — E039 Hypothyroidism, unspecified: Secondary | ICD-10-CM | POA: Diagnosis not present

## 2022-02-23 DIAGNOSIS — E538 Deficiency of other specified B group vitamins: Secondary | ICD-10-CM | POA: Diagnosis not present

## 2022-02-23 DIAGNOSIS — E559 Vitamin D deficiency, unspecified: Secondary | ICD-10-CM | POA: Diagnosis not present

## 2022-02-23 DIAGNOSIS — I10 Essential (primary) hypertension: Secondary | ICD-10-CM | POA: Diagnosis not present

## 2022-02-23 DIAGNOSIS — D649 Anemia, unspecified: Secondary | ICD-10-CM | POA: Diagnosis not present

## 2022-03-02 DIAGNOSIS — M549 Dorsalgia, unspecified: Secondary | ICD-10-CM | POA: Diagnosis not present

## 2022-03-02 DIAGNOSIS — G8929 Other chronic pain: Secondary | ICD-10-CM | POA: Diagnosis not present

## 2022-03-02 DIAGNOSIS — E785 Hyperlipidemia, unspecified: Secondary | ICD-10-CM | POA: Diagnosis not present

## 2022-03-02 DIAGNOSIS — F0280 Dementia in other diseases classified elsewhere without behavioral disturbance: Secondary | ICD-10-CM | POA: Diagnosis not present

## 2022-03-02 DIAGNOSIS — I10 Essential (primary) hypertension: Secondary | ICD-10-CM | POA: Diagnosis not present

## 2022-03-02 DIAGNOSIS — E039 Hypothyroidism, unspecified: Secondary | ICD-10-CM | POA: Diagnosis not present

## 2022-03-02 DIAGNOSIS — G309 Alzheimer's disease, unspecified: Secondary | ICD-10-CM | POA: Diagnosis not present

## 2022-07-03 DIAGNOSIS — E785 Hyperlipidemia, unspecified: Secondary | ICD-10-CM | POA: Diagnosis not present

## 2022-07-03 DIAGNOSIS — G309 Alzheimer's disease, unspecified: Secondary | ICD-10-CM | POA: Diagnosis not present

## 2022-07-03 DIAGNOSIS — I10 Essential (primary) hypertension: Secondary | ICD-10-CM | POA: Diagnosis not present

## 2022-07-03 DIAGNOSIS — E611 Iron deficiency: Secondary | ICD-10-CM | POA: Diagnosis not present

## 2022-07-03 DIAGNOSIS — E039 Hypothyroidism, unspecified: Secondary | ICD-10-CM | POA: Diagnosis not present

## 2022-07-03 DIAGNOSIS — E559 Vitamin D deficiency, unspecified: Secondary | ICD-10-CM | POA: Diagnosis not present

## 2022-07-03 DIAGNOSIS — E538 Deficiency of other specified B group vitamins: Secondary | ICD-10-CM | POA: Diagnosis not present

## 2022-07-03 DIAGNOSIS — F0280 Dementia in other diseases classified elsewhere without behavioral disturbance: Secondary | ICD-10-CM | POA: Diagnosis not present

## 2022-09-26 DIAGNOSIS — E611 Iron deficiency: Secondary | ICD-10-CM | POA: Diagnosis not present

## 2022-09-26 DIAGNOSIS — I10 Essential (primary) hypertension: Secondary | ICD-10-CM | POA: Diagnosis not present

## 2022-09-26 DIAGNOSIS — E039 Hypothyroidism, unspecified: Secondary | ICD-10-CM | POA: Diagnosis not present

## 2022-09-26 DIAGNOSIS — E538 Deficiency of other specified B group vitamins: Secondary | ICD-10-CM | POA: Diagnosis not present

## 2022-09-26 DIAGNOSIS — E559 Vitamin D deficiency, unspecified: Secondary | ICD-10-CM | POA: Diagnosis not present

## 2022-09-26 DIAGNOSIS — Z23 Encounter for immunization: Secondary | ICD-10-CM | POA: Diagnosis not present

## 2022-09-26 DIAGNOSIS — E785 Hyperlipidemia, unspecified: Secondary | ICD-10-CM | POA: Diagnosis not present

## 2022-12-20 NOTE — Telephone Encounter (Signed)
Error

## 2022-12-26 DIAGNOSIS — E785 Hyperlipidemia, unspecified: Secondary | ICD-10-CM | POA: Diagnosis not present

## 2022-12-26 DIAGNOSIS — N289 Disorder of kidney and ureter, unspecified: Secondary | ICD-10-CM | POA: Diagnosis not present

## 2022-12-26 DIAGNOSIS — I10 Essential (primary) hypertension: Secondary | ICD-10-CM | POA: Diagnosis not present

## 2022-12-26 DIAGNOSIS — E039 Hypothyroidism, unspecified: Secondary | ICD-10-CM | POA: Diagnosis not present

## 2023-01-01 DIAGNOSIS — N289 Disorder of kidney and ureter, unspecified: Secondary | ICD-10-CM | POA: Diagnosis not present

## 2023-04-16 DIAGNOSIS — G309 Alzheimer's disease, unspecified: Secondary | ICD-10-CM | POA: Diagnosis not present

## 2023-04-16 DIAGNOSIS — I10 Essential (primary) hypertension: Secondary | ICD-10-CM | POA: Diagnosis not present

## 2023-04-16 DIAGNOSIS — E785 Hyperlipidemia, unspecified: Secondary | ICD-10-CM | POA: Diagnosis not present

## 2023-04-16 DIAGNOSIS — F0280 Dementia in other diseases classified elsewhere without behavioral disturbance: Secondary | ICD-10-CM | POA: Diagnosis not present

## 2023-04-16 DIAGNOSIS — D649 Anemia, unspecified: Secondary | ICD-10-CM | POA: Diagnosis not present

## 2023-04-16 DIAGNOSIS — R059 Cough, unspecified: Secondary | ICD-10-CM | POA: Diagnosis not present

## 2023-04-16 DIAGNOSIS — R8281 Pyuria: Secondary | ICD-10-CM | POA: Diagnosis not present

## 2023-04-16 DIAGNOSIS — E892 Postprocedural hypoparathyroidism: Secondary | ICD-10-CM | POA: Diagnosis not present

## 2023-04-16 DIAGNOSIS — E039 Hypothyroidism, unspecified: Secondary | ICD-10-CM | POA: Diagnosis not present

## 2023-07-17 DIAGNOSIS — M25562 Pain in left knee: Secondary | ICD-10-CM | POA: Diagnosis not present

## 2023-07-17 DIAGNOSIS — E538 Deficiency of other specified B group vitamins: Secondary | ICD-10-CM | POA: Diagnosis not present

## 2023-07-17 DIAGNOSIS — E039 Hypothyroidism, unspecified: Secondary | ICD-10-CM | POA: Diagnosis not present

## 2023-07-17 DIAGNOSIS — D649 Anemia, unspecified: Secondary | ICD-10-CM | POA: Diagnosis not present

## 2023-07-17 DIAGNOSIS — E785 Hyperlipidemia, unspecified: Secondary | ICD-10-CM | POA: Diagnosis not present

## 2023-07-17 DIAGNOSIS — I10 Essential (primary) hypertension: Secondary | ICD-10-CM | POA: Diagnosis not present

## 2023-10-10 DIAGNOSIS — G309 Alzheimer's disease, unspecified: Secondary | ICD-10-CM | POA: Diagnosis not present

## 2023-10-10 DIAGNOSIS — E785 Hyperlipidemia, unspecified: Secondary | ICD-10-CM | POA: Diagnosis not present

## 2023-10-10 DIAGNOSIS — D649 Anemia, unspecified: Secondary | ICD-10-CM | POA: Diagnosis not present

## 2023-10-10 DIAGNOSIS — F0280 Dementia in other diseases classified elsewhere without behavioral disturbance: Secondary | ICD-10-CM | POA: Diagnosis not present

## 2023-10-10 DIAGNOSIS — I10 Essential (primary) hypertension: Secondary | ICD-10-CM | POA: Diagnosis not present

## 2023-10-10 DIAGNOSIS — E611 Iron deficiency: Secondary | ICD-10-CM | POA: Diagnosis not present

## 2023-10-10 DIAGNOSIS — E039 Hypothyroidism, unspecified: Secondary | ICD-10-CM | POA: Diagnosis not present

## 2024-01-14 DIAGNOSIS — E611 Iron deficiency: Secondary | ICD-10-CM | POA: Diagnosis not present

## 2024-01-14 DIAGNOSIS — R634 Abnormal weight loss: Secondary | ICD-10-CM | POA: Diagnosis not present

## 2024-01-14 DIAGNOSIS — D649 Anemia, unspecified: Secondary | ICD-10-CM | POA: Diagnosis not present

## 2024-01-14 DIAGNOSIS — G309 Alzheimer's disease, unspecified: Secondary | ICD-10-CM | POA: Diagnosis not present

## 2024-01-14 DIAGNOSIS — E785 Hyperlipidemia, unspecified: Secondary | ICD-10-CM | POA: Diagnosis not present

## 2024-01-14 DIAGNOSIS — E039 Hypothyroidism, unspecified: Secondary | ICD-10-CM | POA: Diagnosis not present

## 2024-01-14 DIAGNOSIS — N289 Disorder of kidney and ureter, unspecified: Secondary | ICD-10-CM | POA: Diagnosis not present

## 2024-01-14 DIAGNOSIS — I10 Essential (primary) hypertension: Secondary | ICD-10-CM | POA: Diagnosis not present

## 2024-01-14 DIAGNOSIS — E538 Deficiency of other specified B group vitamins: Secondary | ICD-10-CM | POA: Diagnosis not present

## 2024-02-04 DIAGNOSIS — K5732 Diverticulitis of large intestine without perforation or abscess without bleeding: Secondary | ICD-10-CM | POA: Diagnosis not present

## 2024-02-04 DIAGNOSIS — R634 Abnormal weight loss: Secondary | ICD-10-CM | POA: Diagnosis not present

## 2024-02-04 DIAGNOSIS — J8410 Pulmonary fibrosis, unspecified: Secondary | ICD-10-CM | POA: Diagnosis not present

## 2024-04-15 DIAGNOSIS — R059 Cough, unspecified: Secondary | ICD-10-CM | POA: Diagnosis not present

## 2024-04-15 DIAGNOSIS — F0280 Dementia in other diseases classified elsewhere without behavioral disturbance: Secondary | ICD-10-CM | POA: Diagnosis not present

## 2024-04-15 DIAGNOSIS — I10 Essential (primary) hypertension: Secondary | ICD-10-CM | POA: Diagnosis not present

## 2024-04-15 DIAGNOSIS — E039 Hypothyroidism, unspecified: Secondary | ICD-10-CM | POA: Diagnosis not present

## 2024-04-15 DIAGNOSIS — G309 Alzheimer's disease, unspecified: Secondary | ICD-10-CM | POA: Diagnosis not present

## 2024-04-15 DIAGNOSIS — E785 Hyperlipidemia, unspecified: Secondary | ICD-10-CM | POA: Diagnosis not present

## 2024-04-15 DIAGNOSIS — R2681 Unsteadiness on feet: Secondary | ICD-10-CM | POA: Diagnosis not present

## 2024-06-01 DIAGNOSIS — I249 Acute ischemic heart disease, unspecified: Secondary | ICD-10-CM | POA: Diagnosis not present

## 2024-06-01 DIAGNOSIS — I4510 Unspecified right bundle-branch block: Secondary | ICD-10-CM | POA: Diagnosis not present

## 2024-06-01 DIAGNOSIS — J449 Chronic obstructive pulmonary disease, unspecified: Secondary | ICD-10-CM | POA: Diagnosis not present

## 2024-06-01 DIAGNOSIS — D72829 Elevated white blood cell count, unspecified: Secondary | ICD-10-CM | POA: Diagnosis not present

## 2024-06-01 DIAGNOSIS — Z743 Need for continuous supervision: Secondary | ICD-10-CM | POA: Diagnosis not present

## 2024-06-01 DIAGNOSIS — N183 Chronic kidney disease, stage 3 unspecified: Secondary | ICD-10-CM | POA: Diagnosis not present

## 2024-06-01 DIAGNOSIS — R0609 Other forms of dyspnea: Secondary | ICD-10-CM | POA: Diagnosis not present

## 2024-06-01 DIAGNOSIS — I214 Non-ST elevation (NSTEMI) myocardial infarction: Secondary | ICD-10-CM | POA: Diagnosis not present

## 2024-06-01 DIAGNOSIS — N179 Acute kidney failure, unspecified: Secondary | ICD-10-CM | POA: Diagnosis not present

## 2024-06-01 DIAGNOSIS — M549 Dorsalgia, unspecified: Secondary | ICD-10-CM | POA: Diagnosis not present

## 2024-06-01 DIAGNOSIS — R079 Chest pain, unspecified: Secondary | ICD-10-CM | POA: Diagnosis not present

## 2024-06-01 DIAGNOSIS — F0390 Unspecified dementia without behavioral disturbance: Secondary | ICD-10-CM | POA: Diagnosis not present

## 2024-06-01 DIAGNOSIS — G8929 Other chronic pain: Secondary | ICD-10-CM | POA: Diagnosis not present

## 2024-06-01 DIAGNOSIS — F0394 Unspecified dementia, unspecified severity, with anxiety: Secondary | ICD-10-CM | POA: Diagnosis not present

## 2024-06-01 DIAGNOSIS — R0789 Other chest pain: Secondary | ICD-10-CM | POA: Diagnosis not present

## 2024-06-01 DIAGNOSIS — R0602 Shortness of breath: Secondary | ICD-10-CM | POA: Diagnosis not present

## 2024-06-02 DIAGNOSIS — F0390 Unspecified dementia without behavioral disturbance: Secondary | ICD-10-CM | POA: Diagnosis not present

## 2024-06-02 DIAGNOSIS — I214 Non-ST elevation (NSTEMI) myocardial infarction: Secondary | ICD-10-CM | POA: Diagnosis not present

## 2024-06-02 DIAGNOSIS — E785 Hyperlipidemia, unspecified: Secondary | ICD-10-CM | POA: Diagnosis not present

## 2024-06-02 DIAGNOSIS — N179 Acute kidney failure, unspecified: Secondary | ICD-10-CM | POA: Diagnosis not present

## 2024-06-03 DIAGNOSIS — E785 Hyperlipidemia, unspecified: Secondary | ICD-10-CM | POA: Diagnosis not present

## 2024-06-03 DIAGNOSIS — N289 Disorder of kidney and ureter, unspecified: Secondary | ICD-10-CM | POA: Diagnosis not present

## 2024-06-03 DIAGNOSIS — F17201 Nicotine dependence, unspecified, in remission: Secondary | ICD-10-CM | POA: Diagnosis not present

## 2024-06-03 DIAGNOSIS — F0390 Unspecified dementia without behavioral disturbance: Secondary | ICD-10-CM | POA: Diagnosis not present

## 2024-06-03 DIAGNOSIS — I214 Non-ST elevation (NSTEMI) myocardial infarction: Secondary | ICD-10-CM | POA: Diagnosis not present

## 2024-06-03 DIAGNOSIS — N183 Chronic kidney disease, stage 3 unspecified: Secondary | ICD-10-CM | POA: Diagnosis not present

## 2024-06-24 DIAGNOSIS — E538 Deficiency of other specified B group vitamins: Secondary | ICD-10-CM | POA: Diagnosis not present

## 2024-06-24 DIAGNOSIS — E038 Other specified hypothyroidism: Secondary | ICD-10-CM | POA: Diagnosis not present

## 2024-06-24 DIAGNOSIS — R079 Chest pain, unspecified: Secondary | ICD-10-CM | POA: Diagnosis not present

## 2024-07-16 DIAGNOSIS — F0390 Unspecified dementia without behavioral disturbance: Secondary | ICD-10-CM | POA: Diagnosis not present

## 2024-07-16 DIAGNOSIS — E785 Hyperlipidemia, unspecified: Secondary | ICD-10-CM | POA: Diagnosis not present

## 2024-07-16 DIAGNOSIS — E039 Hypothyroidism, unspecified: Secondary | ICD-10-CM | POA: Diagnosis not present

## 2024-07-16 DIAGNOSIS — D649 Anemia, unspecified: Secondary | ICD-10-CM | POA: Diagnosis not present

## 2024-07-16 DIAGNOSIS — I10 Essential (primary) hypertension: Secondary | ICD-10-CM | POA: Diagnosis not present
